# Patient Record
Sex: Female | Born: 1965 | Race: White | Hispanic: No | Marital: Married | Smoking: Never smoker
Health system: Southern US, Community
[De-identification: ages and names within clinical notes are randomized; demographics above are authoritative.]

## PROBLEM LIST (undated history)

## (undated) DIAGNOSIS — G43909 Migraine, unspecified, not intractable, without status migrainosus: Secondary | ICD-10-CM

## (undated) DIAGNOSIS — M1990 Unspecified osteoarthritis, unspecified site: Secondary | ICD-10-CM

## (undated) DIAGNOSIS — K649 Unspecified hemorrhoids: Secondary | ICD-10-CM

## (undated) DIAGNOSIS — E039 Hypothyroidism, unspecified: Secondary | ICD-10-CM

## (undated) DIAGNOSIS — C801 Malignant (primary) neoplasm, unspecified: Secondary | ICD-10-CM

## (undated) DIAGNOSIS — K219 Gastro-esophageal reflux disease without esophagitis: Secondary | ICD-10-CM

## (undated) DIAGNOSIS — E079 Disorder of thyroid, unspecified: Secondary | ICD-10-CM

## (undated) HISTORY — PX: TONSILLECTOMY: SUR1361

---

## 2009-12-15 HISTORY — PX: BLADDER SUSPENSION: SHX72

## 2014-11-30 ENCOUNTER — Encounter: Payer: Self-pay | Admitting: *Deleted

## 2014-11-30 ENCOUNTER — Emergency Department (INDEPENDENT_AMBULATORY_CARE_PROVIDER_SITE_OTHER)
Admission: EM | Admit: 2014-11-30 | Discharge: 2014-11-30 | Disposition: A | Discharge status: Home / Self Care | Payer: BC Managed Care – PPO | Source: Home / Self Care | Attending: Emergency Medicine | Admitting: Emergency Medicine

## 2014-11-30 DIAGNOSIS — H65192 Other acute nonsuppurative otitis media, left ear: Secondary | ICD-10-CM

## 2014-11-30 HISTORY — DX: Disorder of thyroid, unspecified: E07.9

## 2014-11-30 HISTORY — DX: Migraine, unspecified, not intractable, without status migrainosus: G43.909

## 2014-11-30 MED ORDER — CEFDINIR 300 MG PO CAPS: 300.0000 mg | ORAL_CAPSULE | Freq: Two times a day (BID) | ORAL | Status: DC

## 2014-11-30 NOTE — ED Notes (Addendum)
Pt c/o LT ear pain x 2 days. Denies fever.

## 2014-11-30 NOTE — ED Provider Notes (Signed)
CSN: [id number]     Arrival date & time 11/30/14  [id number] History   First MD Initiated Contact with Patient 11/30/14 [phone]     Chief Complaint  Patient presents with  . Otalgia   (Consider location/radiation/quality/duration/timing/severity/associated sxs/prior Treatment) Patient is a 48 y.o. female presenting with ear pain. The history is provided by the patient. No language interpreter was used.  Otalgia Location:  Left Behind ear:  Redness Quality:  Aching Severity:  Moderate Onset quality:  Gradual Duration:  4 days Timing:  Constant Progression:  Worsening Chronicity:  New Relieved by:  Nothing Worsened by:  Nothing tried Ineffective treatments:  None tried Associated symptoms: congestion and cough     Past Medical History  Diagnosis Date  . Thyroid disease   . Migraine    Past Surgical History  Procedure Laterality Date  . Tonsillectomy     Family History  Problem Relation Age of Onset  . Hypertension Mother   . Hypertension Father   . Diabetes Father    History  Substance Use Topics  . Smoking status: Never Smoker   . Smokeless tobacco: Not on file  . Alcohol Use: No   OB History    No data available     Review of Systems  HENT: Positive for congestion and ear pain.   Respiratory: Positive for cough.   All other systems reviewed and are negative.   Allergies  Review of patient's allergies indicates no known allergies.  Home Medications   Prior to Admission medications   Medication Sig Start Date End Date Taking? Authorizing Provider  esomeprazole (NEXIUM) 40 MG capsule Take 40 mg by mouth daily at 12 noon.   Yes Historical Provider, MD  levothyroxine (SYNTHROID, LEVOTHROID) 125 MCG tablet Take 125 mcg by mouth daily before breakfast.   Yes Historical Provider, MD  Multiple Vitamin (MULTI-VITAMINS PO) Take by mouth.   Yes Historical Provider, MD  SUMAtriptan (IMITREX) 25 MG tablet Take 25 mg by mouth every 2 (two) hours as needed for migraine or  headache. May repeat in 2 hours if headache persists or recurs.   Yes Historical Provider, MD  topiramate (TOPAMAX) 100 MG tablet Take 100 mg by mouth 2 (two) times daily.   Yes Historical Provider, MD  cefdinir (OMNICEF) 300 MG capsule Take 1 capsule (300 mg total) by mouth 2 (two) times daily. 11/30/14   Fransico Meadow, PA-C   BP 114/62 mmHg  Pulse 63  Temp(Src) 97.9 F (36.6 C) (Oral)  Resp 16  Ht 5\' 6"  (1.676 m)  Wt 203 lb (92.08 kg)  BMI 32.78 kg/m2  SpO2 99% Physical Exam  Constitutional: She is oriented to person, place, and time. She appears well-developed and well-nourished.  HENT:  Head: Normocephalic and atraumatic.  Left tm erythematous  Eyes: EOM are normal.  Neck: Normal range of motion.  Cardiovascular: Normal rate.   Pulmonary/Chest: Effort normal and breath sounds normal.  Abdominal: Soft. She exhibits no distension.  Musculoskeletal: Normal range of motion.  Neurological: She is alert and oriented to person, place, and time.  Psychiatric: She has a normal mood and affect.  Nursing note and vitals reviewed.   ED Course  Procedures (including critical care time) Labs Review Labs Reviewed - No data to display  Imaging Review No results found.   MDM   1. Acute nonsuppurative otitis media of left ear    cefdiner Referral for primary care AVS  Fransico Meadow, PA-C 11/30/14 0093

## 2014-11-30 NOTE — Discharge Instructions (Signed)

## 2016-02-11 ENCOUNTER — Encounter: Payer: Self-pay | Admitting: *Deleted

## 2016-02-11 ENCOUNTER — Emergency Department (INDEPENDENT_AMBULATORY_CARE_PROVIDER_SITE_OTHER)
Admission: EM | Admit: 2016-02-11 | Discharge: 2016-02-11 | Disposition: A | Discharge status: Home / Self Care | Payer: BLUE CROSS/BLUE SHIELD | Source: Home / Self Care | Attending: Family Medicine | Admitting: Family Medicine

## 2016-02-11 DIAGNOSIS — J069 Acute upper respiratory infection, unspecified: Secondary | ICD-10-CM | POA: Diagnosis not present

## 2016-02-11 DIAGNOSIS — B9789 Other viral agents as the cause of diseases classified elsewhere: Principal | ICD-10-CM

## 2016-02-11 NOTE — ED Notes (Signed)
Pt c/o productive cough, sneezing, body aches, temp 100.1, and bilateral ear ache x 4 days.

## 2016-02-11 NOTE — ED Provider Notes (Signed)
CSN: [id number]     Arrival date & time 02/11/16  [id number] History   First MD Initiated Contact with Patient 02/11/16 1022     Chief Complaint  Patient presents with  . Cough  . Otalgia      HPI Comments: Four days ago patient developed flu-like illness including myalgias, headache, fever/chills, fatigue, and cough.  Also has mild nasal congestion and sore throat.  Cough is non-productive and somewhat worse at night.  No pleuritic pain or shortness of breath. Her symptoms are generally improving except that her ears feel clogged.  She would like to ensure that she does not have an ear infection.    The history is provided by the patient.    Past Medical History  Diagnosis Date  . Thyroid disease   . Migraine    Past Surgical History  Procedure Laterality Date  . Tonsillectomy     Family History  Problem Relation Age of Onset  . Hypertension Mother   . Hypertension Father   . Diabetes Father    Social History  Substance Use Topics  . Smoking status: Never Smoker   . Smokeless tobacco: None  . Alcohol Use: No   OB History    No data available     Review of Systems + sore throat + hoarse + sneezing + cough No pleuritic pain No wheezing + nasal congestion + post-nasal drainage No sinus pain/pressure No itchy/red eyes ? earache No hemoptysis No SOB + fever, + chills No nausea No vomiting No abdominal pain No diarrhea No urinary symptoms No skin rash + fatigue + myalgias + headache Used OTC meds without relief d Allergies  Review of patient's allergies indicates no known allergies.  Home Medications   Prior to Admission medications   Medication Sig Start Date End Date Taking? Authorizing Provider  omeprazole (PRILOSEC) 40 MG capsule Take 40 mg by mouth daily.   Yes Historical Provider, MD  levothyroxine (SYNTHROID, LEVOTHROID) 125 MCG tablet Take 125 mcg by mouth daily before breakfast.    Historical Provider, MD  Multiple Vitamin (MULTI-VITAMINS PO) Take  by mouth.    Historical Provider, MD  SUMAtriptan (IMITREX) 25 MG tablet Take 25 mg by mouth every 2 (two) hours as needed for migraine or headache. May repeat in 2 hours if headache persists or recurs.    Historical Provider, MD  topiramate (TOPAMAX) 100 MG tablet Take 100 mg by mouth 2 (two) times daily.    Historical Provider, MD   Meds Ordered and Administered this Visit  Medications - No data to display  BP 123/79 mmHg  Pulse 77  Temp(Src) 98 F (36.7 C) (Oral)  Resp 16  Ht 5' 5.5" (1.664 m)  Wt 201 lb (91.173 kg)  BMI 32.93 kg/m2  SpO2 100% No data found.   Physical Exam Nursing notes and Vital Signs reviewed. Appearance:  Patient appears stated age, and in no acute distress Eyes:  Pupils are equal, round, and reactive to light and accomodation.  Extraocular movement is intact.  Conjunctivae are not inflamed  Ears:  Canals normal.  Tympanic membranes normal.  Nose:  Mildly congested turbinates.  No sinus tenderness.   Pharynx:  Normal Neck:  Supple.  Tender enlarged posterior nodes are palpated bilaterally  Lungs:  Clear to auscultation.  Breath sounds are equal.  Moving air well. Heart:  Regular rate and rhythm without murmurs, rubs, or gallops.  Abdomen:  Nontender without masses or hepatosplenomegaly.  Bowel sounds are present.  No CVA  or flank tenderness.  Extremities:  No edema.  Skin:  No rash present.   ED Course  Procedures  None     Labs Reviewed -   Tympanogram:  Normal both ears    MDM   1. Viral URI with cough    There is no evidence of bacterial infection today.  Treat symptomatically for now  Take plain guaifenesin (1200mg  extended release tabs such as Mucinex) twice daily, with plenty of water, for cough and congestion.  Get adequate rest.   May use Afrin nasal spray (or generic oxymetazoline) twice daily for about 5 days and then discontinue.  Also recommend using saline nasal spray several times daily and saline nasal irrigation (AYR is a common  brand).  May add Flonase nasal spray each morning after using Afrin nasal spray and saline nasal irrigation. Try warm salt water gargles for sore throat.  Stop all antihistamines for now, and other non-prescription cough/cold preparations.   Follow-up with family doctor if not improving about one week     Kandra Nicolas, MD 02/11/16 1121

## 2016-02-11 NOTE — Discharge Instructions (Signed)
Take plain guaifenesin (1200mg  extended release tabs such as Mucinex) twice daily, with plenty of water, for cough and congestion.  Get adequate rest.   May use Afrin nasal spray (or generic oxymetazoline) twice daily for about 5 days and then discontinue.  Also recommend using saline nasal spray several times daily and saline nasal irrigation (AYR is a common brand).  May add Flonase nasal spray each morning after using Afrin nasal spray and saline nasal irrigation. Try warm salt water gargles for sore throat.  Stop all antihistamines for now, and other non-prescription cough/cold preparations.   Follow-up with family doctor if not improving about one week

## 2016-03-19 ENCOUNTER — Other Ambulatory Visit: Payer: Self-pay | Admitting: Family Medicine

## 2016-03-19 DIAGNOSIS — G43909 Migraine, unspecified, not intractable, without status migrainosus: Secondary | ICD-10-CM | POA: Diagnosis not present

## 2016-03-19 DIAGNOSIS — M542 Cervicalgia: Secondary | ICD-10-CM | POA: Diagnosis not present

## 2016-03-19 DIAGNOSIS — Z8249 Family history of ischemic heart disease and other diseases of the circulatory system: Secondary | ICD-10-CM | POA: Diagnosis not present

## 2016-03-31 ENCOUNTER — Ambulatory Visit
Admission: RE | Admit: 2016-03-31 | Discharge: 2016-03-31 | Disposition: A | Discharge status: Home / Self Care | Payer: BLUE CROSS/BLUE SHIELD | Source: Ambulatory Visit | Attending: Family Medicine | Admitting: Family Medicine

## 2016-03-31 DIAGNOSIS — G43909 Migraine, unspecified, not intractable, without status migrainosus: Secondary | ICD-10-CM

## 2016-03-31 DIAGNOSIS — Z8249 Family history of ischemic heart disease and other diseases of the circulatory system: Secondary | ICD-10-CM

## 2016-03-31 DIAGNOSIS — R51 Headache: Secondary | ICD-10-CM | POA: Diagnosis not present

## 2016-04-04 ENCOUNTER — Ambulatory Visit (INDEPENDENT_AMBULATORY_CARE_PROVIDER_SITE_OTHER): Payer: BLUE CROSS/BLUE SHIELD | Admitting: Neurology

## 2016-04-04 ENCOUNTER — Encounter: Payer: Self-pay | Admitting: Neurology

## 2016-04-04 VITALS — BP 131/80 | HR 72 | Ht 65.5 in | Wt 206.4 lb

## 2016-04-04 DIAGNOSIS — I10 Essential (primary) hypertension: Secondary | ICD-10-CM | POA: Diagnosis not present

## 2016-04-04 DIAGNOSIS — R51 Headache: Secondary | ICD-10-CM | POA: Diagnosis not present

## 2016-04-04 DIAGNOSIS — H538 Other visual disturbances: Secondary | ICD-10-CM | POA: Diagnosis not present

## 2016-04-04 DIAGNOSIS — H93A2 Pulsatile tinnitus, left ear: Secondary | ICD-10-CM

## 2016-04-04 DIAGNOSIS — R519 Headache, unspecified: Secondary | ICD-10-CM

## 2016-04-04 DIAGNOSIS — E038 Other specified hypothyroidism: Secondary | ICD-10-CM

## 2016-04-04 DIAGNOSIS — G43709 Chronic migraine without aura, not intractable, without status migrainosus: Secondary | ICD-10-CM

## 2016-04-04 DIAGNOSIS — M542 Cervicalgia: Secondary | ICD-10-CM | POA: Diagnosis not present

## 2016-04-04 MED ORDER — NORTRIPTYLINE HCL 25 MG PO CAPS: 25.0000 mg | ORAL_CAPSULE | Freq: Every day | ORAL | Status: DC

## 2016-04-04 MED ORDER — SUMATRIPTAN SUCCINATE 3 MG/0.5ML ~~LOC~~ SOAJ: 1.0000 | SUBCUTANEOUS | Status: DC

## 2016-04-04 NOTE — Progress Notes (Signed)
Faxed enrollment form to [facility]: [employer] For zembrace/sumatriptan tablet. Fax: [phone]. Received confirmation.

## 2016-04-04 NOTE — Progress Notes (Signed)
[id number]    Provider:  Dr Jaynee Eagles Referring Provider: Donald Prose, MD Primary Care Physician:  Lynne Logan, MD  CC:  migraines  HPI:  Sarah Hayes is a 50 y.o. female here as a referral from Dr. Nancy Fetter for migraines. PMHx HTN, hypothyroidism, migraine. She has been on Topamax for 9 years. She has been on 100mg . In the past this has been good. Migraines started worsening in the last few months, since January worsening with increased frequencya nd severity, can be severe. She has been under stress. She has shoulder pain and is also going to an orthopaedist. The pain travels from the shoulder up tot he head. That has been exacerbating her migraines. Starts on the right, behind the eyes, spreads to the whole head, pounding, throbbing, light sensitivity, phonophobia. Needs to go to bed in a dark room. She has tried imitrex, lately not working. She has had a headache since Saturday. Las Wed and Thursday she had severe headache. Imitrex is working less. Sometimes the migraine can be instantaneous. She has had injections before and they worked great but insurance didn't pay. She has 20 headaches or more a month, She is getting headaches in the middle of the night and in the morning. She has at least 10 being migrainous per month for the last 3 months. She hydrates well. She is not sleeping well recently. She goes to be at 11am, she gets up at [facility] and she is not getting more than a fw hours of sleep a night. She has some right-sided vision changes. She has a FHx of aneurysms. She has ringing in the ears in a pulsing quality of the left ear. No aura. No history of heart disease or symptoms. No other focal neurologic complaints or symptoms/signs. No FHx of migraines. Can have vomiting with headaches making oral acute management difficult also the onset to max severity is very fast making oral medications less effective.   Meds tried: Topamax, Imitrex PO and nasal, other medications and triptans  (will request records), OTC meds such as nsaids and tylenol.   Reviewed notes, labs and imaging from outside physicians, which showed:  Sister with a wide-necked left supraclinoid ICA aneurysm.   CT of the head 03/31/2016(personally reviewed and agree with findings below): No evidence for acute infarction, hemorrhage, mass lesion, hydrocephalus, or extra-axial fluid. No atrophy or white matter disease. Intact calvarium. No acute sinus or mastoid disease.  Labs 12/25/2015: CBC with diff and CMp unremarkable (creatinine 0.81), tsh 3.09, ldl 101,   IMPRESSION: Negative exam. If further investigation desired, with regard to the family history of aneurysm, MRA or CTA of the intracranial circulation recommended for further evaluation.  Review of Systems: Patient complains of symptoms per HPI as well as the following symptoms:eye pain, headache, numbness, restless legs, right eye pain. Pertinent negatives per HPI. All others negative.   Social History   Social History  . Marital Status: Married    Spouse Name: Sarah Hayes  . Number of Children: 3  . Years of Education: 12   Occupational History  . [employer]    Social History Main Topics  . Smoking status: Never Smoker   . Smokeless tobacco: Not on file  . Alcohol Use: No  . Drug Use: No  . Sexual Activity: Not on file   Other Topics Concern  . Not on file   Social History Narrative   Lives with family   Caffeine use: none    Family History  Problem Relation  Age of Onset  . Hypertension Mother   . Hypertension Father   . Diabetes Father   . Migraines Neg Hx     Past Medical History  Diagnosis Date  . Thyroid disease   . Migraine     Past Surgical History  Procedure Laterality Date  . Tonsillectomy      Current Outpatient Prescriptions  Medication Sig Dispense Refill  . levothyroxine (SYNTHROID, LEVOTHROID) 125 MCG tablet Take 125 mcg by mouth daily before breakfast.    . Multiple Vitamin (MULTI-VITAMINS PO)  Take by mouth.    Marland Kitchen omeprazole (PRILOSEC) 40 MG capsule Take 40 mg by mouth daily.    Marland Kitchen topiramate (TOPAMAX) 100 MG tablet Take 100 mg by mouth 2 (two) times daily.    . nortriptyline (PAMELOR) 25 MG capsule Take 1 capsule (25 mg total) by mouth at bedtime. 30 capsule 12  . SUMAtriptan Succinate (ZEMBRACE SYMTOUCH) 3 MG/0.5ML SOAJ Inject 1 Dose into the skin every 1 hour x 4 doses. 10 pen 12   No current facility-administered medications for this visit.    Allergies as of 04/04/2016  . (No Known Allergies)    Vitals: BP 131/80 mmHg  Pulse 72  Ht 5' 5.5" (1.664 m)  Wt 206 lb 6.4 oz (93.622 kg)  BMI 33.81 kg/m2 Last Weight:  Wt Readings from Last 1 Encounters:  04/04/16 206 lb 6.4 oz (93.622 kg)   Last Height:   Ht Readings from Last 1 Encounters:  04/04/16 5' 5.5" (1.664 m)    Physical exam: Exam: Gen: NAD, conversant, well nourised, obese, well groomed                     CV: RRR, no MRG. No Carotid Bruits. No peripheral edema, warm, nontender Eyes: Conjunctivae clear without exudates or hemorrhage  Neuro: Detailed Neurologic Exam  Speech:    Speech is normal; fluent and spontaneous with normal comprehension.  Cognition:    The patient is oriented to person, place, and time;     recent and remote memory intact;     language fluent;     normal attention, concentration,     fund of knowledge Cranial Nerves:    The pupils are equal, round, and reactive to light. The fundi are normal and spontaneous venous pulsations are present. Visual fields are full to finger confrontation. Extraocular movements are intact. Trigeminal sensation is intact and the muscles of mastication are normal. The face is symmetric. The palate elevates in the midline. Hearing intact. Voice is normal. Shoulder shrug is normal. The tongue has normal motion without fasciculations.   Coordination:    Normal finger to nose and heel to shin. Normal rapid alternating movements.   Gait:    Heel-toe and  tandem gait are normal.   Motor Observation:    No asymmetry, no atrophy, and no involuntary movements noted. Tone:    Normal muscle tone.    Posture:    Posture is normal. normal erect    Strength:    Strength is V/V in the upper and lower limbs.      Sensation: intact to LT     Reflex Exam:  DTR's:    Deep tendon reflexes in the upper and lower extremities are normal bilaterally.   Toes:    The toes are downgoing bilaterally.   Clonus:    Clonus is absent.      Assessment/Plan:  50 year with worsening migraines, chronic, no aura, not intractable. Exacerbated by not  sleeping well, recent neck and shoulde rpain (seeing orthopaedics soon).    As far as your medications are concerned, I would like to suggest: Continue Topamax, however increases above 200mg /day often cause side effects will try Nortriptyline 25mg  at night. Zembrace at onset. Can repeat every hour up to 4x a day. Nortriptyline at night,can help with migraines as well as insomnia and neck pain.  Discussed side effects including teratogenicity, do not Pregnant on this medication and use birth control. Serious side effects can include hypotension, hypertension, syncope, ventricular arrhythmias, QT prolongation and other cardiac side effects, stroke and seizures, ataxia tardive dyskinesias, extrapyramidal symptoms, increased intraocular pressure, leukopenia, thrombocytopenia, hallucinations, suicidality and other serious side effects. Common reactions include drowsiness, dry mouth, dizziness, constipation, blurred vision, palpitations, tachycardia, impaired coordination, increased appetite, nausea vomiting, weakness, confusion, disorientation, restlessness, anxiety and other side effects.    As far as diagnostic testing: CTA of the head due to pulsatile left ear tinnitus and FHx of aneurysms, bmp today  To prevent or relieve headaches, try the following: Cool Compress. Lie down and place a cool compress on your head.    Avoid headache triggers. If certain foods or odors seem to have triggered your migraines in the past, avoid them. A headache diary might help you identify triggers.  Include physical activity in your daily routine. Try a daily walk or other moderate aerobic exercise.  Manage stress. Find healthy ways to cope with the stressors, such as delegating tasks on your to-do list.  Practice relaxation techniques. Try deep breathing, yoga, massage and visualization.  Eat regularly. Eating regularly scheduled meals and maintaining a healthy diet might help prevent headaches. Also, drink plenty of fluids.  Follow a regular sleep schedule. Sleep deprivation might contribute to headaches Consider biofeedback. With this mind-body technique, you learn to control certain bodily functions - such as muscle tension, heart rate and blood pressure - to prevent headaches or reduce headache pain.    Proceed to emergency room if you experience new or worsening symptoms or symptoms do not resolve, if you have new neurologic symptoms or if headache is severe, or for any concerning symptom.   Sarina Ill, MD  [facility] [address] [place], [place] [address]  Phone [phone] Fax [phone]

## 2016-04-04 NOTE — Patient Instructions (Addendum)
Remember to drink plenty of fluid, eat healthy meals and do not skip any meals. Try to eat protein with a every meal and eat a healthy snack such as fruit or nuts in between meals. Try to keep a regular sleep-wake schedule and try to exercise daily, particularly in the form of walking, 20-30 minutes a day, if you can.   As far as your medications are concerned, I would like to suggest: Nortriptyline 25mg  at night. Zembrace at onset. Can repeat every hour up to 4x a day.   As far as diagnostic testing: CTA of the head, lab   Our phone number is [phone]. We also have an after hours call service for urgent matters and there is a physician on-call for urgent questions. For any emergencies you know to call 911 or go to the nearest emergency room

## 2016-04-12 ENCOUNTER — Encounter: Payer: Self-pay | Admitting: Neurology

## 2016-04-12 DIAGNOSIS — I10 Essential (primary) hypertension: Secondary | ICD-10-CM | POA: Insufficient documentation

## 2016-04-12 DIAGNOSIS — G43709 Chronic migraine without aura, not intractable, without status migrainosus: Secondary | ICD-10-CM | POA: Insufficient documentation

## 2016-04-12 DIAGNOSIS — E039 Hypothyroidism, unspecified: Secondary | ICD-10-CM | POA: Insufficient documentation

## 2016-04-17 ENCOUNTER — Telehealth: Payer: Self-pay | Admitting: Neurology

## 2016-04-17 ENCOUNTER — Ambulatory Visit
Admission: RE | Admit: 2016-04-17 | Discharge: 2016-04-17 | Disposition: A | Discharge status: Home / Self Care | Payer: BLUE CROSS/BLUE SHIELD | Source: Ambulatory Visit | Attending: Neurology | Admitting: Neurology

## 2016-04-17 DIAGNOSIS — H93A2 Pulsatile tinnitus, left ear: Secondary | ICD-10-CM

## 2016-04-17 DIAGNOSIS — R519 Headache, unspecified: Secondary | ICD-10-CM

## 2016-04-17 DIAGNOSIS — H538 Other visual disturbances: Secondary | ICD-10-CM

## 2016-04-17 DIAGNOSIS — R51 Headache: Secondary | ICD-10-CM | POA: Diagnosis not present

## 2016-04-17 MED ORDER — IOPAMIDOL (ISOVUE-370) INJECTION 76%
100.0000 mL | Freq: Once | INTRAVENOUS | Status: AC | PRN
  Administered 2016-04-17: 100 mL via INTRAVENOUS

## 2016-04-17 NOTE — Telephone Encounter (Signed)
Called again. Left 3 messages today. Sarah Hayes, could you call patient first thing in the morning. 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 This can be causing some pressure behind her eyes. Can be causing headaches. We need to do a more invasive test called a cerebral angiogram to further examine. If she has any worsening vision changes or worsening headaches she needs to go to the emergency room. Carotid-cavernous sinus fistulae occur because of traumatic or spontaneous leaking of the internal carotid artery or its branches. This results in pooling of blood into cavernous sinuses(space behind the eyes).   I know it is hard to understand over the phone, I can give her a call later tomorrow morning when I am not with patients. She is also welcome to come in tomorrow at 12:30 and I can explain this to her and show her the images. I just want her to know so that to get the cerebral angiogram ordered. Please let me know, thanks

## 2016-04-17 NOTE — Telephone Encounter (Signed)
Called patient about imaging, left a message. Will try back later.  IMPRESSION: 1. Asymmetric early enhancement of the left cavernous sinus and slight dilation of the left superior ophthalmic vein consistent with a carotid cavernous fistula. Recommend catheter cerebral arteriogram for further evaluation. 2. No evidence for aneurysm.

## 2016-04-17 NOTE — Telephone Encounter (Signed)
[facility] [phone] called sts MRI is in EPIC and ready to view. He said Dr Cecilie Lowers recommended a catheter and then cerebral arteriogram for further eval.

## 2016-04-18 ENCOUNTER — Other Ambulatory Visit: Payer: Self-pay | Admitting: Neurology

## 2016-04-18 ENCOUNTER — Ambulatory Visit (INDEPENDENT_AMBULATORY_CARE_PROVIDER_SITE_OTHER): Payer: BLUE CROSS/BLUE SHIELD | Admitting: Neurology

## 2016-04-18 ENCOUNTER — Encounter: Payer: Self-pay | Admitting: Neurology

## 2016-04-18 VITALS — BP 125/81 | HR 76 | Ht 65.5 in | Wt 206.4 lb

## 2016-04-18 DIAGNOSIS — R930 Abnormal findings on diagnostic imaging of skull and head, not elsewhere classified: Secondary | ICD-10-CM

## 2016-04-18 DIAGNOSIS — I671 Cerebral aneurysm, nonruptured: Secondary | ICD-10-CM

## 2016-04-18 DIAGNOSIS — I770 Arteriovenous fistula, acquired: Secondary | ICD-10-CM

## 2016-04-18 NOTE — Telephone Encounter (Signed)
Called [facility] about scheduling cerebral angiogram. Anderson Malta who schedules for cerebral angiogram was in consultation with Dr Estanislado Pandy at that time. She asked me to call back in 30 min to schedule. Advised to call [phone].

## 2016-04-18 NOTE — Telephone Encounter (Signed)
LVM for Anderson Malta in scheduling cerebral angiogram for pt. Gave [facility] phone number if she has further questions. Advised office now closed, we will reopen Monday at 8am.

## 2016-04-18 NOTE — Telephone Encounter (Signed)
Patient was seen in the office today and imaging discussed thanks

## 2016-04-18 NOTE — Telephone Encounter (Signed)
Patient returned Dr. Cathren Laine call. Please call [phone].

## 2016-04-18 NOTE — Telephone Encounter (Signed)
Called pt back. Relayed Dr Jaynee Eagles message below RE results. Pt verbalized understanding. Dr Jaynee Eagles also spoke to pt while on phone.

## 2016-04-19 DIAGNOSIS — I671 Cerebral aneurysm, nonruptured: Secondary | ICD-10-CM | POA: Insufficient documentation

## 2016-04-19 NOTE — Progress Notes (Signed)
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 She is here with husband. Reviewed imaging with patient and husband and showed them the area that is suspicious on CTA of the head and explained the images. Discussed the following: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 This can be causing some pressure behind her eyes. Can be causing headaches. We need to do a more invasive test called a cerebral angiogram to further examine. If she has any worsening vision changes or worsening headaches she needs to go to the emergency room ASAP. Carotid-cavernous sinus fistulae occur because of traumatic or spontaneous leaking of the internal carotid artery or its branches. This results in pooling of blood into cavernous sinuses(space behind the eyes). This can cause permanent vision loss, headaches, eye pain, redness of the eye, proptosis of the eye, abnormal eye movements. Patient is largely asymptomatic at this time except for headaches. Have referred for cerebral angiogram. If symptoms worsen patient is to go to the emergency room as soon as possible or call 911.  IMPRESSION: 1. Asymmetric early enhancement of the left cavernous sinus and slight dilation of the left superior ophthalmic vein consistent with a carotid cavernous fistula. Recommend catheter cerebral arteriogram for further evaluation. 2. No evidence for aneurysm.  HPI: Sarah Hayes is a 50 y.o. female here as a referral from Dr. Nancy Fetter for migraines. PMHx HTN, hypothyroidism, migraine. She has been on Topamax for 9 years. She has been on 100mg . In the  past this has been good. Migraines started worsening in the last few months, since January worsening with increased frequencya nd severity, can be severe. She has been under stress. She has shoulder pain and is also going to an orthopaedist. The pain travels from the shoulder up tot he head. That has been exacerbating her migraines. Starts on the right, behind the eyes, spreads to the whole head, pounding, throbbing, light sensitivity, phonophobia. Needs to go to bed in a dark room. She has tried imitrex, lately not working. She has had a headache since Saturday. Las Wed and Thursday she had severe headache. Imitrex is working less. Sometimes the migraine can be instantaneous. She has had injections before and they worked great but insurance didn't pay. She has 20 headaches or more a month, She is getting headaches in the middle of the night and in the morning. She has at least 10 being migrainous per month for the last 3 months. She hydrates well. She is not sleeping well recently. She goes to be at 11am, she gets up at [facility] and she is not getting more than a fw hours of sleep a night. She has some right-sided vision changes. She has a FHx of aneurysms. She has ringing in the ears in a pulsing quality of the left ear. No aura. No history of heart disease or symptoms. No other focal neurologic complaints or symptoms/signs. No FHx of migraines. Can have vomiting with headaches making oral acute management difficult also the onset to max severity is very fast making oral medications less effective.   Meds tried: Topamax, Imitrex PO and nasal, other medications and triptans (will request records), OTC meds  such as nsaids and tylenol.   Reviewed notes, labs and imaging from outside physicians, which showed:  Sister with a wide-necked left supraclinoid ICA aneurysm.   CT of the head 03/31/2016(personally reviewed and agree with findings below): No evidence for acute infarction, hemorrhage, mass lesion,  hydrocephalus, or extra-axial fluid. No atrophy or white matter disease. Intact calvarium. No acute sinus or mastoid disease.  Labs 12/25/2015: CBC with diff and CMp unremarkable (creatinine 0.81), tsh 3.09, ldl 101,   IMPRESSION: Negative exam. If further investigation desired, with regard to the family history of aneurysm, MRA or CTA of the intracranial circulation recommended for further evaluation.  Review of Systems: Patient complains of symptoms per HPI as well as the following symptoms:eye pain, headache, numbness, restless legs, right eye pain. Pertinent negatives per HPI. All others negative.   Social History   Social History  . Marital Status: Married    Spouse Name: [name]  . Number of Children: 3  . Years of Education: 12   Occupational History  . [employer]    Social History Main Topics  . Smoking status: Never Smoker   . Smokeless tobacco: Not on file  . Alcohol Use: No  . Drug Use: No  . Sexual Activity: Not on file   Other Topics Concern  . Not on file   Social History Narrative   Lives with family   Caffeine use: none    Family History  Problem Relation Age of Onset  . Hypertension Mother   . Hypertension Father   . Diabetes Father   . Migraines Neg Hx     Past Medical History  Diagnosis Date  . Thyroid disease   . Migraine     Past Surgical History  Procedure Laterality Date  . Tonsillectomy      Current Outpatient Prescriptions  Medication Sig Dispense Refill  . levothyroxine (SYNTHROID, LEVOTHROID) 125 MCG tablet Take 125 mcg by mouth daily before breakfast.    . methocarbamol (ROBAXIN) 500 MG tablet Take 500 mg by mouth every 8 (eight) hours.  1  . Multiple Vitamin (MULTI-VITAMINS PO) Take by mouth.    . nortriptyline (PAMELOR) 25 MG capsule Take 1 capsule (25 mg total) by mouth at bedtime. 30 capsule 12  . omeprazole (PRILOSEC) 40 MG capsule Take 40 mg by mouth daily.    . SUMAtriptan Succinate (ZEMBRACE SYMTOUCH) 3 MG/0.5ML  SOAJ Inject 1 Dose into the skin every 1 hour x 4 doses. 10 pen 12  . topiramate (TOPAMAX) 100 MG tablet Take 100 mg by mouth 2 (two) times daily.     No current facility-administered medications for this visit.    Allergies as of 04/18/2016  . (No Known Allergies)    Vitals: BP 125/81 mmHg  Pulse 76  Ht 5' 5.5" (1.664 m)  Wt 206 lb 6.4 oz (93.622 kg)  BMI 33.81 kg/m2 Last Weight:  Wt Readings from Last 1 Encounters:  04/18/16 206 lb 6.4 oz (93.622 kg)   Last Height:   Ht Readings from Last 1 Encounters:  04/18/16 5' 5.5" (1.664 m)    Neuro: Detailed Neurologic Exam  Speech:  Speech is normal; fluent and spontaneous with normal comprehension.  Cognition:  The patient is oriented to person, place, and time;   recent and remote memory intact;   language fluent;   normal attention, concentration,   fund of knowledge Cranial Nerves:  The pupils are equal, round, and reactive to light. The fundi are normal and spontaneous venous pulsations  are present. Visual fields are full to finger confrontation. Extraocular movements are intact. Trigeminal sensation is intact and the muscles of mastication are normal. The face is symmetric. The palate elevates in the midline. Hearing intact. Voice is normal. Shoulder shrug is normal. The tongue has normal motion without fasciculations.   Coordination:  Normal finger to nose and heel to shin. Normal rapid alternating movements.   Gait:  Heel-toe and tandem gait are normal.   Motor Observation:  No asymmetry, no atrophy, and no involuntary movements noted. Tone:  Normal muscle tone.   Posture:  Posture is normal. normal erect   Strength:  Strength is V/V in the upper and lower limbs.    Sensation: intact to LT   Reflex Exam:  DTR's:  Deep tendon reflexes in the upper and lower extremities are normal bilaterally.  Toes:  The toes are downgoing bilaterally.  Clonus:   Clonus is absent.     Assessment/Plan: 50 year with worsening migraines, chronic, no aura, not intractable. Exacerbated by not sleeping well, recent neck and shoulde rpain (seeing orthopaedics soon).   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 Adeline discussion with patient and her husband and we reviewed images together.his can cause permanent vision loss, headaches, eye pain, redness of the eye, proptosis of the eye, abnormal eye movements. Patient is largely asymptomatic at this time except for headaches. Have referred for cerebral angiogram. If symptoms worsen patient is to go to the emergency room as soon as possible or call 911. I referred her for cerebral angiogram. Watch blood pressure, no strenuous exercises.  As far as your medications are concerned, I would like to suggest: Continue Topamax, however increases above 200mg /day often cause side effects will try Nortriptyline 25mg  at night. Zembrace at onset. Can repeat every hour up to 4x a day. Nortriptyline at night,can help with migraines as well as insomnia and neck pain. Discussed side effects including teratogenicity, do not Pregnant on this medication and use birth control. Serious side effects can include hypotension, hypertension, syncope, ventricular arrhythmias, QT prolongation and other cardiac side effects, stroke and seizures, ataxia tardive dyskinesias, extrapyramidal symptoms, increased intraocular pressure, leukopenia, thrombocytopenia, hallucinations, suicidality and other serious side effects. Common reactions include drowsiness, dry mouth, dizziness, constipation, blurred vision, palpitations, tachycardia, impaired coordination, increased appetite, nausea vomiting, weakness, confusion, disorientation, restlessness, anxiety and other side effects.    As far as diagnostic testing: CTA of the head due to pulsatile left ear tinnitus and FHx of aneurysms, bmp today  To prevent or  relieve headaches, try the following:  Cool Compress. Lie down and place a cool compress on your head.   Avoid headache triggers. If certain foods or odors seem to have triggered your migraines in the past, avoid them. A headache diary might help you identify triggers.   Include physical activity in your daily routine. Try a daily walk or other moderate aerobic exercise.   Manage stress. Find healthy ways to cope with the stressors, such as delegating tasks on your to-do list.   Practice relaxation techniques. Try deep breathing, yoga, massage and visualization.   Eat regularly. Eating regularly scheduled meals and maintaining a healthy diet might help prevent headaches. Also, drink plenty of fluids.   Follow a regular sleep schedule. Sleep deprivation might contribute to headaches  Consider biofeedback. With this mind-body technique, you learn to control certain bodily functions - such as muscle tension, heart rate and blood pressure - to prevent headaches or  reduce headache pain.   Proceed to emergency room if you experience new or worsening symptoms or symptoms do not resolve, if you have new neurologic symptoms or if headache is severe, or for any concerning symptom.   Sarina Ill, MD  [facility] [address] [place], [place] [address]  Phone [phone] Fax [phone]  A total of 30 minutes was spent face-to-face with this patient. Over half this time was spent on counseling patient on the carotid sinus cyst diagnosis and different diagnostic and therapeutic options available.

## 2016-04-21 ENCOUNTER — Encounter: Payer: Self-pay | Admitting: Neurology

## 2016-04-22 NOTE — Telephone Encounter (Signed)
Thank you!  Please let patient know

## 2016-04-22 NOTE — Telephone Encounter (Signed)
Caryl Pina called from Dr Estanislado Pandy office [phone] or [phone] said she saw the message and see the order. Dr Estanislado Pandy has look at it and she will try to get it scheduled today.

## 2016-04-22 NOTE — Telephone Encounter (Signed)
Dr Ahern- FYI 

## 2016-04-23 ENCOUNTER — Other Ambulatory Visit (HOSPITAL_COMMUNITY): Payer: Self-pay | Admitting: Interventional Radiology

## 2016-04-23 DIAGNOSIS — I671 Cerebral aneurysm, nonruptured: Secondary | ICD-10-CM

## 2016-04-23 NOTE — Telephone Encounter (Signed)
Called pt. Relayed that Dr Estanislado Pandy office (most likely [place]) can see order for cerebral angiogram and should be calling her to schedule. Gave pt number to office to call if she does not hear [phone] . Told her to call if she has further questions. She verbalized understanding.

## 2016-04-25 ENCOUNTER — Ambulatory Visit (HOSPITAL_COMMUNITY): Payer: BLUE CROSS/BLUE SHIELD

## 2016-04-28 ENCOUNTER — Other Ambulatory Visit: Payer: Self-pay | Admitting: General Surgery

## 2016-04-29 ENCOUNTER — Other Ambulatory Visit: Payer: Self-pay | Admitting: Radiology

## 2016-04-30 ENCOUNTER — Other Ambulatory Visit: Payer: Self-pay | Admitting: Neurology

## 2016-04-30 ENCOUNTER — Ambulatory Visit (HOSPITAL_COMMUNITY)
Admission: RE | Admit: 2016-04-30 | Discharge: 2016-04-30 | Disposition: A | Discharge status: Home / Self Care | Payer: BLUE CROSS/BLUE SHIELD | Source: Ambulatory Visit | Attending: Neurology | Admitting: Neurology

## 2016-04-30 ENCOUNTER — Encounter (HOSPITAL_COMMUNITY): Payer: Self-pay

## 2016-04-30 DIAGNOSIS — Z79899 Other long term (current) drug therapy: Secondary | ICD-10-CM | POA: Diagnosis not present

## 2016-04-30 DIAGNOSIS — I671 Cerebral aneurysm, nonruptured: Secondary | ICD-10-CM

## 2016-04-30 DIAGNOSIS — Z8489 Family history of other specified conditions: Secondary | ICD-10-CM | POA: Insufficient documentation

## 2016-04-30 DIAGNOSIS — H93A2 Pulsatile tinnitus, left ear: Secondary | ICD-10-CM | POA: Insufficient documentation

## 2016-04-30 DIAGNOSIS — Z8249 Family history of ischemic heart disease and other diseases of the circulatory system: Secondary | ICD-10-CM | POA: Insufficient documentation

## 2016-04-30 DIAGNOSIS — E079 Disorder of thyroid, unspecified: Secondary | ICD-10-CM | POA: Insufficient documentation

## 2016-04-30 DIAGNOSIS — R51 Headache: Secondary | ICD-10-CM | POA: Diagnosis not present

## 2016-04-30 DIAGNOSIS — G43909 Migraine, unspecified, not intractable, without status migrainosus: Secondary | ICD-10-CM | POA: Diagnosis not present

## 2016-04-30 DIAGNOSIS — Z833 Family history of diabetes mellitus: Secondary | ICD-10-CM | POA: Diagnosis not present

## 2016-04-30 LAB — CBC
HCT: 39.3 % (ref 36.0–46.0)
HEMOGLOBIN: 13.3 g/dL (ref 12.0–15.0)
MCH: 29.2 pg (ref 26.0–34.0)
MCHC: 33.8 g/dL (ref 30.0–36.0)
MCV: 86.4 fL (ref 78.0–100.0)
PLATELETS: 126 10*3/uL — AB (ref 150–400)
RBC: 4.55 MIL/uL (ref 3.87–5.11)
RDW: 13.3 % (ref 11.5–15.5)
WBC: 6.1 10*3/uL (ref 4.0–10.5)

## 2016-04-30 LAB — PREGNANCY, URINE: Preg Test, Ur: NEGATIVE

## 2016-04-30 LAB — BASIC METABOLIC PANEL
ANION GAP: 11 (ref 5–15)
BUN: 20 mg/dL (ref 6–20)
CALCIUM: 9 mg/dL (ref 8.9–10.3)
CO2: 22 mmol/L (ref 22–32)
Chloride: 110 mmol/L (ref 101–111)
Creatinine, Ser: 0.81 mg/dL (ref 0.44–1.00)
Glucose, Bld: 107 mg/dL — ABNORMAL HIGH (ref 65–99)
Potassium: 3.6 mmol/L (ref 3.5–5.1)
Sodium: 143 mmol/L (ref 135–145)

## 2016-04-30 LAB — PROTIME-INR
INR: 1.06 (ref 0.00–1.49)
PROTHROMBIN TIME: 14 s (ref 11.6–15.2)

## 2016-04-30 LAB — APTT: APTT: 20 s — AB (ref 24–37)

## 2016-04-30 MED ORDER — HEPARIN SOD (PORK) LOCK FLUSH 100 UNIT/ML IV SOLN
INTRAVENOUS | Status: AC | PRN
  Administered 2016-04-30: 500 [IU] via INTRAVENOUS

## 2016-04-30 MED ORDER — IOPAMIDOL (ISOVUE-300) INJECTION 61%
INTRAVENOUS | Status: AC
  Administered 2016-04-30: 25 mL
  Filled 2016-04-30: qty 100

## 2016-04-30 MED ORDER — LIDOCAINE HCL 1 % IJ SOLN
INTRAMUSCULAR | Status: AC
  Administered 2016-04-30: 10 mL
  Filled 2016-04-30: qty 20

## 2016-04-30 MED ORDER — FENTANYL CITRATE (PF) 100 MCG/2ML IJ SOLN
INTRAMUSCULAR | Status: AC
  Filled 2016-04-30: qty 2

## 2016-04-30 MED ORDER — MIDAZOLAM HCL 2 MG/2ML IJ SOLN
INTRAMUSCULAR | Status: AC | PRN
  Administered 2016-04-30: 1 mg via INTRAVENOUS

## 2016-04-30 MED ORDER — SODIUM CHLORIDE 0.9 % IV SOLN
INTRAVENOUS | Status: AC
  Administered 2016-04-30: 11:00:00 via INTRAVENOUS

## 2016-04-30 MED ORDER — SODIUM CHLORIDE 0.9 % IV SOLN: INTRAVENOUS | Status: DC

## 2016-04-30 MED ORDER — IOPAMIDOL (ISOVUE-300) INJECTION 61%
INTRAVENOUS | Status: AC
  Administered 2016-04-30: 75 mL
  Filled 2016-04-30: qty 150

## 2016-04-30 MED ORDER — FENTANYL CITRATE (PF) 100 MCG/2ML IJ SOLN
INTRAMUSCULAR | Status: AC | PRN
  Administered 2016-04-30: 25 ug via INTRAVENOUS

## 2016-04-30 MED ORDER — HEPARIN SODIUM (PORCINE) 1000 UNIT/ML IJ SOLN
INTRAMUSCULAR | Status: AC | PRN
  Administered 2016-04-30: 1000 [IU] via INTRAVENOUS

## 2016-04-30 MED ORDER — HEPARIN SODIUM (PORCINE) 1000 UNIT/ML IJ SOLN
INTRAMUSCULAR | Status: AC
  Filled 2016-04-30: qty 1

## 2016-04-30 MED ORDER — MIDAZOLAM HCL 2 MG/2ML IJ SOLN
INTRAMUSCULAR | Status: AC
  Filled 2016-04-30: qty 2

## 2016-04-30 NOTE — Sedation Documentation (Signed)
Patient is resting comfortably. 

## 2016-04-30 NOTE — Sedation Documentation (Signed)
Patient is resting comfortably. No complaints at this time, vitals stable. 

## 2016-04-30 NOTE — Sedation Documentation (Signed)
IR tech holding pressure at this time to right groin, site unremarkable.

## 2016-04-30 NOTE — Sedation Documentation (Signed)
Vital signs stable. Pt is resting. Responds to commands.

## 2016-04-30 NOTE — Sedation Documentation (Signed)
Patient is resting comfortably. No complaints at this time 

## 2016-04-30 NOTE — Sedation Documentation (Signed)
Pressure held by IR tech for 15 mins, vpad and tegaderm in place. Right groin site unremarkable.

## 2016-04-30 NOTE — Procedures (Signed)
S/P 4 vessel cerebral arteriogram,and Lt external carotid arteriogram. RT  CFA approac. Findings. 1.No abnormal AV communications,dissections or occlusions seen intracranially or extracranially. 2.Venous outflow WNLs.

## 2016-04-30 NOTE — Discharge Instructions (Signed)
Angiogram, Care After °Refer to this sheet in the next few weeks. These instructions provide you with information about caring for yourself after your procedure. Your health care provider may also give you more specific instructions. Your treatment has been planned according to current medical practices, but problems sometimes occur. Call your health care provider if you have any problems or questions after your procedure. °WHAT TO EXPECT AFTER THE PROCEDURE °After your procedure, it is typical to have the following: °· Bruising at the catheter insertion site that usually fades within 1-2 weeks. °· Blood collecting in the tissue (hematoma) that may be painful to the touch. It should usually decrease in size and tenderness within 1-2 weeks. °HOME CARE INSTRUCTIONS °· Take medicines only as directed by your health care provider. °· You may shower 24-48 hours after the procedure or as directed by your health care provider. Remove the bandage (dressing) and gently wash the site with plain soap and water. Pat the area dry with a clean towel. Do not rub the site, because this may cause bleeding. °· Do not take baths, swim, or use a hot tub until your health care provider approves. °· Check your insertion site every day for redness, swelling, or drainage. °· Do not apply powder or lotion to the site. °· Do not lift over 10 lb (4.5 kg) for 5 days after your procedure or as directed by your health care provider. °· Ask your health care provider when it is okay to: °¨ Return to work or school. °¨ Resume usual physical activities or sports. °¨ Resume sexual activity. °· Do not drive home if you are discharged the same day as the procedure. Have someone else drive you. °· You may drive 24 hours after the procedure unless otherwise instructed by your health care provider. °· Do not operate machinery or power tools for 24 hours after the procedure or as directed by your health care provider. °· If your procedure was done as an  outpatient procedure, which means that you went home the same day as your procedure, a responsible adult should be with you for the first 24 hours after you arrive home. °· Keep all follow-up visits as directed by your health care provider. This is important. °SEEK MEDICAL CARE IF: °· You have a fever. °· You have chills. °· You have increased bleeding from the catheter insertion site. Hold pressure on the site.  CALL 911 °SEEK IMMEDIATE MEDICAL CARE IF: °· You have unusual pain at the catheter insertion site. °· You have redness, warmth, or swelling at the catheter insertion site. °· You have drainage (other than a small amount of blood on the dressing) from the catheter insertion site. °· The catheter insertion site is bleeding, and the bleeding does not stop after 30 minutes of holding steady pressure on the site. °· The area near or just beyond the catheter insertion site becomes pale, cool, tingly, or numb. °  °This information is not intended to replace advice given to you by your health care provider. Make sure you discuss any questions you have with your health care provider. °  °Document Released: 06/19/2005 Document Revised: 12/22/2014 Document Reviewed: 05/04/2013 °Elsevier Interactive Patient Education ©2016 [employer] ° °

## 2016-04-30 NOTE — Sedation Documentation (Signed)
Vital signs stable. 

## 2016-04-30 NOTE — H&P (Signed)
Chief Complaint: Patient was seen in consultation today for cerebral arteriogram at the request of [place] B  Referring Physician(s): Melvenia Beam  Supervising Physician: Luanne Bras  Patient Status: Out-pt  History of Present Illness: Sarah Hayes is a 50 y.o. female   Hx migraine headaches Recent change in headaches causing some visual symptoms Left ear tinnitus Sister with recent cerebral aneurysm---in [place]- survived Work up with Dr Jaynee Eagles CTA Head 04/17/16: IMPRESSION: 1. Asymmetric early enhancement of the left cavernous sinus and slight dilation of the left superior ophthalmic vein consistent with a carotid cavernous fistula. Recommend catheter cerebral arteriogram for further evaluation. 2. No evidence for aneurysm.  Now scheduled for cerebral arteriogram for evaluation   Past Medical History  Diagnosis Date  . Thyroid disease   . Migraine     Past Surgical History  Procedure Laterality Date  . Tonsillectomy      Allergies: Review of patient's allergies indicates no known allergies.  Medications: Prior to Admission medications   Medication Sig Start Date End Date Taking? Authorizing Provider  levothyroxine (SYNTHROID, LEVOTHROID) 125 MCG tablet Take 125 mcg by mouth daily before breakfast.   Yes Historical Provider, MD  methocarbamol (ROBAXIN) 500 MG tablet Take 500 mg by mouth every 8 (eight) hours. 04/04/16  Yes Historical Provider, MD  Multiple Vitamin (MULTI-VITAMINS PO) Take 1 tablet by mouth daily.    Yes Historical Provider, MD  nortriptyline (PAMELOR) 25 MG capsule Take 1 capsule (25 mg total) by mouth at bedtime. 04/04/16  Yes Melvenia Beam, MD  omeprazole (PRILOSEC) 40 MG capsule Take 40 mg by mouth daily.   Yes Historical Provider, MD  SUMAtriptan (IMITREX) 100 MG tablet Take 100 mg by mouth every 2 (two) hours as needed for migraine.  04/10/16  Yes Historical Provider, MD  SUMAtriptan Succinate (ZEMBRACE SYMTOUCH) 3  MG/0.5ML SOAJ Inject 1 Dose into the skin every 1 hour x 4 doses. 04/04/16  Yes Melvenia Beam, MD  topiramate (TOPAMAX) 100 MG tablet Take 100 mg by mouth 2 (two) times daily.   Yes Historical Provider, MD     Family History  Problem Relation Age of Onset  . Hypertension Mother   . Hypertension Father   . Diabetes Father   . Migraines Neg Hx     Social History   Social History  . Marital Status: Married    Spouse Name: [name]  . Number of Children: 3  . Years of Education: 12   Occupational History  . [employer]    Social History Main Topics  . Smoking status: Never Smoker   . Smokeless tobacco: None  . Alcohol Use: No  . Drug Use: No  . Sexual Activity: Not Asked   Other Topics Concern  . None   Social History Narrative   Lives with family   Caffeine use: none    Review of Systems: A 12 point ROS discussed and pertinent positives are indicated in the HPI above.  All other systems are negative.  Review of Systems  Constitutional: Negative for fever, activity change, appetite change and fatigue.  HENT: Positive for tinnitus. Negative for hearing loss.   Eyes: Positive for visual disturbance.  Respiratory: Negative for shortness of breath.   Cardiovascular: Negative for chest pain.  Gastrointestinal: Negative for abdominal pain.  Musculoskeletal: Negative for back pain.  Neurological: Positive for headaches. Negative for dizziness, tremors, seizures, syncope, facial asymmetry, speech difficulty, weakness, light-headedness and numbness.  Psychiatric/Behavioral: Negative for confusion and decreased concentration.  Vital Signs: BP 130/88 mmHg  Temp(Src) 97.8 F (36.6 C) (Oral)  Resp 20  Ht 5\' 5"  (1.651 m)  Wt 206 lb (93.441 kg)  BMI 34.28 kg/m2  SpO2 99%  LMP 03/17/2016  Physical Exam  Constitutional: She is oriented to person, place, and time. She appears well-nourished.  HENT:  Head: Atraumatic.  Eyes: EOM are normal.  Neck: Neck supple.    Cardiovascular: Normal rate, regular rhythm and normal heart sounds.   No murmur heard. Pulmonary/Chest: Effort normal and breath sounds normal. She has no wheezes.  Abdominal: Soft. Bowel sounds are normal. There is no tenderness.  Musculoskeletal: Normal range of motion. She exhibits no edema or tenderness.  Neurological: She is alert and oriented to person, place, and time.  Skin: Skin is warm and dry.  Psychiatric: She has a normal mood and affect. Her behavior is normal. Judgment and thought content normal.  Nursing note and vitals reviewed.   Mallampati Score:  MD Evaluation Airway: WNL Heart: WNL Abdomen: WNL Chest/ Lungs: WNL ASA  Classification: 2 Mallampati/Airway Score: Two  Imaging: Ct Angio Head W/cm &/or Wo Cm  04/17/2016  CLINICAL DATA:  Chronic headaches with progression over the past several months. Blurred vision. Sister with cerebral aneurysm. Pulsatile tinnitus of the left the ear. EXAM: CT ANGIOGRAPHY HEAD TECHNIQUE: Multidetector CT imaging of the head was performed using the standard protocol during bolus administration of intravenous contrast. Multiplanar CT image reconstructions and MIPs were obtained to evaluate the vascular anatomy. CONTRAST:  100 mL Isovue 370 COMPARISON:  None. FINDINGS: CT HEAD Brain: No acute infarct, hemorrhage, or mass lesion is present. The basal ganglia are intact. No focal cortical lesion is evident. The ventricles are of normal size. No significant extraaxial fluid collection is present. Calvarium and skull base: Within normal limits. Paranasal sinuses: Clear Orbits: Unremarkable CTA HEAD Anterior circulation: The right internal carotid artery is within normal limits from the high cervical segments through the ICA terminus. The left internal carotid artery slightly decreased in caliber compared to the right. There is asymmetric enhancement of the left cavernous sinus which is not present on the right. The terminal ICA is within normal  limits bilaterally. The A1 and M1 segments are normal. ACA and MCA branch vessels are within normal limits bilaterally. Posterior circulation: The right vertebral artery is dominant to the left. PICA origins are visualized and normal. The right posterior cerebral artery is of fetal type. The left posterior cerebral artery originates from the basilar tip. A small P1 segment is present on the right. PCA branch vessels are intact. Venous sinuses: The dural sinuses are patent. The straight sinus deep cerebral veins are within normal limits. The left superior ophthalmic vein is slightly more prominent than on the right. Anatomic variants: Fetal type right posterior cerebral artery. Delayed phase:The postcontrast images demonstrate no pathologic enhancement. IMPRESSION: 1. Asymmetric early enhancement of the left cavernous sinus and slight dilation of the left superior ophthalmic vein consistent with a carotid cavernous fistula. Recommend catheter cerebral arteriogram for further evaluation. 2. No evidence for aneurysm. Electronically Signed   By: San Morelle M.D.   On: 04/17/2016 10:36   Ct Head Wo Contrast  03/31/2016  CLINICAL DATA:  Worsening headaches. RIGHT-sided numbness. No injury. EXAM: CT HEAD WITHOUT CONTRAST TECHNIQUE: Contiguous axial images were obtained from the base of the skull through the vertex without intravenous contrast. COMPARISON:  None. FINDINGS: No evidence for acute infarction, hemorrhage, mass lesion, hydrocephalus, or extra-axial fluid. No atrophy or white  matter disease. Intact calvarium. No acute sinus or mastoid disease. IMPRESSION: Negative exam. If further investigation desired, with regard to the family history of aneurysm, MRA or CTA of the intracranial circulation recommended for further evaluation. Electronically Signed   By: Staci Righter M.D.   On: 03/31/2016 10:38    Labs:  CBC: No results for input(s): WBC, HGB, HCT, PLT in the last 8760 hours.  COAGS: No  results for input(s): INR, APTT in the last 8760 hours.  BMP: No results for input(s): NA, K, CL, CO2, GLUCOSE, BUN, CALCIUM, CREATININE, GFRNONAA, GFRAA in the last 8760 hours.  Invalid input(s): CMP  LIVER FUNCTION TESTS: No results for input(s): BILITOT, AST, ALT, ALKPHOS, PROT, ALBUMIN in the last 8760 hours.  TUMOR MARKERS: No results for input(s): AFPTM, CEA, CA199, CHROMGRNA in the last 8760 hours.  Assessment and Plan:  Hx migraine headaches Change in headaches; severity and number Vision changes; L ear tinnitus Abn CTA Head showing carotid cavernous fistula Now scheduled for further evaluation with cerebral arteriogram Risks and Benefits discussed with the patient including, but not limited to bleeding, infection, vascular injury, contrast induced renal failure, stroke or even death. All of the patient's questions were answered, patient is agreeable to proceed. Consent signed and in chart.  Thank you for this interesting consult.  I greatly enjoyed meeting Baya Pressler and look forward to participating in their care.  A copy of this report was sent to the requesting provider on this date.  Electronically Signed: Chastidy Ranker A 04/30/2016, 6:40 AM   I spent a total of  30 Minutes   in face to face in clinical consultation, greater than 50% of which was counseling/coordinating care for cerebral arteriogram

## 2016-04-30 NOTE — Sedation Documentation (Signed)
Patient is resting comfortably. No complaints at this time. Vitals stable. 

## 2016-06-05 DIAGNOSIS — D239 Other benign neoplasm of skin, unspecified: Secondary | ICD-10-CM | POA: Diagnosis not present

## 2016-06-05 DIAGNOSIS — D229 Melanocytic nevi, unspecified: Secondary | ICD-10-CM | POA: Diagnosis not present

## 2016-06-05 DIAGNOSIS — C44319 Basal cell carcinoma of skin of other parts of face: Secondary | ICD-10-CM | POA: Diagnosis not present

## 2016-06-05 DIAGNOSIS — D485 Neoplasm of uncertain behavior of skin: Secondary | ICD-10-CM | POA: Diagnosis not present

## 2016-06-19 ENCOUNTER — Encounter: Payer: Self-pay | Admitting: Neurology

## 2016-06-19 DIAGNOSIS — C4401 Basal cell carcinoma of skin of lip: Secondary | ICD-10-CM | POA: Diagnosis not present

## 2016-07-01 DIAGNOSIS — M542 Cervicalgia: Secondary | ICD-10-CM | POA: Diagnosis not present

## 2016-07-01 DIAGNOSIS — M47812 Spondylosis without myelopathy or radiculopathy, cervical region: Secondary | ICD-10-CM | POA: Diagnosis not present

## 2016-07-07 ENCOUNTER — Ambulatory Visit: Payer: BLUE CROSS/BLUE SHIELD | Admitting: Neurology

## 2016-07-08 DIAGNOSIS — D239 Other benign neoplasm of skin, unspecified: Secondary | ICD-10-CM | POA: Diagnosis not present

## 2016-07-09 DIAGNOSIS — M47812 Spondylosis without myelopathy or radiculopathy, cervical region: Secondary | ICD-10-CM | POA: Diagnosis not present

## 2016-07-09 DIAGNOSIS — M542 Cervicalgia: Secondary | ICD-10-CM | POA: Diagnosis not present

## 2016-07-15 DIAGNOSIS — M47812 Spondylosis without myelopathy or radiculopathy, cervical region: Secondary | ICD-10-CM | POA: Diagnosis not present

## 2016-07-15 DIAGNOSIS — M542 Cervicalgia: Secondary | ICD-10-CM | POA: Diagnosis not present

## 2016-07-22 DIAGNOSIS — M542 Cervicalgia: Secondary | ICD-10-CM | POA: Diagnosis not present

## 2016-07-22 DIAGNOSIS — M47812 Spondylosis without myelopathy or radiculopathy, cervical region: Secondary | ICD-10-CM | POA: Diagnosis not present

## 2016-07-25 DIAGNOSIS — M542 Cervicalgia: Secondary | ICD-10-CM | POA: Diagnosis not present

## 2016-07-25 DIAGNOSIS — M47812 Spondylosis without myelopathy or radiculopathy, cervical region: Secondary | ICD-10-CM | POA: Diagnosis not present

## 2016-07-28 DIAGNOSIS — M542 Cervicalgia: Secondary | ICD-10-CM | POA: Diagnosis not present

## 2016-07-28 DIAGNOSIS — M47812 Spondylosis without myelopathy or radiculopathy, cervical region: Secondary | ICD-10-CM | POA: Diagnosis not present

## 2016-08-09 DIAGNOSIS — M67912 Unspecified disorder of synovium and tendon, left shoulder: Secondary | ICD-10-CM | POA: Diagnosis not present

## 2016-08-12 DIAGNOSIS — D2362 Other benign neoplasm of skin of left upper limb, including shoulder: Secondary | ICD-10-CM | POA: Diagnosis not present

## 2016-08-27 DIAGNOSIS — J0100 Acute maxillary sinusitis, unspecified: Secondary | ICD-10-CM | POA: Diagnosis not present

## 2016-09-05 DIAGNOSIS — M67912 Unspecified disorder of synovium and tendon, left shoulder: Secondary | ICD-10-CM | POA: Diagnosis not present

## 2016-09-30 DIAGNOSIS — M24572 Contracture, left ankle: Secondary | ICD-10-CM | POA: Diagnosis not present

## 2016-09-30 DIAGNOSIS — M722 Plantar fascial fibromatosis: Secondary | ICD-10-CM | POA: Diagnosis not present

## 2016-10-31 ENCOUNTER — Other Ambulatory Visit: Payer: Self-pay | Admitting: Orthopedic Surgery

## 2016-11-19 DIAGNOSIS — L814 Other melanin hyperpigmentation: Secondary | ICD-10-CM | POA: Diagnosis not present

## 2016-11-19 DIAGNOSIS — D485 Neoplasm of uncertain behavior of skin: Secondary | ICD-10-CM | POA: Diagnosis not present

## 2016-11-19 DIAGNOSIS — L821 Other seborrheic keratosis: Secondary | ICD-10-CM | POA: Diagnosis not present

## 2016-11-19 DIAGNOSIS — Z85828 Personal history of other malignant neoplasm of skin: Secondary | ICD-10-CM | POA: Diagnosis not present

## 2016-11-19 DIAGNOSIS — D225 Melanocytic nevi of trunk: Secondary | ICD-10-CM | POA: Diagnosis not present

## 2016-11-27 ENCOUNTER — Encounter (HOSPITAL_BASED_OUTPATIENT_CLINIC_OR_DEPARTMENT_OTHER): Payer: Self-pay | Admitting: *Deleted

## 2016-11-28 DIAGNOSIS — Z1231 Encounter for screening mammogram for malignant neoplasm of breast: Secondary | ICD-10-CM | POA: Diagnosis not present

## 2016-12-01 ENCOUNTER — Ambulatory Visit (HOSPITAL_BASED_OUTPATIENT_CLINIC_OR_DEPARTMENT_OTHER): Payer: BLUE CROSS/BLUE SHIELD | Admitting: Anesthesiology

## 2016-12-01 ENCOUNTER — Ambulatory Visit (HOSPITAL_BASED_OUTPATIENT_CLINIC_OR_DEPARTMENT_OTHER)
Admission: RE | Admit: 2016-12-01 | Discharge: 2016-12-01 | Disposition: A | Discharge status: Home / Self Care | Payer: BLUE CROSS/BLUE SHIELD | Source: Ambulatory Visit | Attending: Orthopedic Surgery | Admitting: Orthopedic Surgery

## 2016-12-01 ENCOUNTER — Encounter (HOSPITAL_BASED_OUTPATIENT_CLINIC_OR_DEPARTMENT_OTHER): Payer: Self-pay

## 2016-12-01 ENCOUNTER — Encounter (HOSPITAL_BASED_OUTPATIENT_CLINIC_OR_DEPARTMENT_OTHER)
Admission: RE | Disposition: A | Discharge status: Home / Self Care | Payer: Self-pay | Source: Ambulatory Visit | Attending: Orthopedic Surgery

## 2016-12-01 DIAGNOSIS — M75112 Incomplete rotator cuff tear or rupture of left shoulder, not specified as traumatic: Secondary | ICD-10-CM | POA: Diagnosis not present

## 2016-12-01 DIAGNOSIS — Z6833 Body mass index (BMI) 33.0-33.9, adult: Secondary | ICD-10-CM | POA: Insufficient documentation

## 2016-12-01 DIAGNOSIS — E039 Hypothyroidism, unspecified: Secondary | ICD-10-CM | POA: Insufficient documentation

## 2016-12-01 DIAGNOSIS — I10 Essential (primary) hypertension: Secondary | ICD-10-CM | POA: Diagnosis not present

## 2016-12-01 DIAGNOSIS — Z85828 Personal history of other malignant neoplasm of skin: Secondary | ICD-10-CM | POA: Diagnosis not present

## 2016-12-01 DIAGNOSIS — K219 Gastro-esophageal reflux disease without esophagitis: Secondary | ICD-10-CM | POA: Diagnosis not present

## 2016-12-01 DIAGNOSIS — E669 Obesity, unspecified: Secondary | ICD-10-CM | POA: Insufficient documentation

## 2016-12-01 DIAGNOSIS — M7542 Impingement syndrome of left shoulder: Secondary | ICD-10-CM | POA: Diagnosis not present

## 2016-12-01 DIAGNOSIS — M75102 Unspecified rotator cuff tear or rupture of left shoulder, not specified as traumatic: Secondary | ICD-10-CM | POA: Diagnosis not present

## 2016-12-01 DIAGNOSIS — I739 Peripheral vascular disease, unspecified: Secondary | ICD-10-CM | POA: Diagnosis not present

## 2016-12-01 DIAGNOSIS — G8918 Other acute postprocedural pain: Secondary | ICD-10-CM | POA: Diagnosis not present

## 2016-12-01 DIAGNOSIS — G43909 Migraine, unspecified, not intractable, without status migrainosus: Secondary | ICD-10-CM | POA: Diagnosis not present

## 2016-12-01 HISTORY — DX: Malignant (primary) neoplasm, unspecified: C80.1

## 2016-12-01 HISTORY — DX: Gastro-esophageal reflux disease without esophagitis: K21.9

## 2016-12-01 HISTORY — PX: SHOULDER ARTHROSCOPY WITH ROTATOR CUFF REPAIR AND SUBACROMIAL DECOMPRESSION: SHX5686

## 2016-12-01 HISTORY — DX: Hypothyroidism, unspecified: E03.9

## 2016-12-01 SURGERY — SHOULDER ARTHROSCOPY WITH ROTATOR CUFF REPAIR AND SUBACROMIAL DECOMPRESSION
Anesthesia: General | Site: Shoulder | Laterality: Left

## 2016-12-01 MED ORDER — EPHEDRINE SULFATE 50 MG/ML IJ SOLN
INTRAMUSCULAR | Status: DC | PRN
  Administered 2016-12-01: 10 mg via INTRAVENOUS

## 2016-12-01 MED ORDER — CEFAZOLIN SODIUM-DEXTROSE 2-4 GM/100ML-% IV SOLN: 2.0000 g | INTRAVENOUS | Status: DC

## 2016-12-01 MED ORDER — SUCCINYLCHOLINE CHLORIDE 20 MG/ML IJ SOLN
INTRAMUSCULAR | Status: DC | PRN
  Administered 2016-12-01: 100 mg via INTRAVENOUS

## 2016-12-01 MED ORDER — PROMETHAZINE HCL 25 MG/ML IJ SOLN: 6.2500 mg | INTRAMUSCULAR | Status: DC | PRN

## 2016-12-01 MED ORDER — LIDOCAINE HCL (CARDIAC) 20 MG/ML IV SOLN
INTRAVENOUS | Status: DC | PRN
  Administered 2016-12-01: 80 mg via INTRAVENOUS

## 2016-12-01 MED ORDER — MIDAZOLAM HCL 2 MG/2ML IJ SOLN
1.0000 mg | INTRAMUSCULAR | Status: DC | PRN
  Administered 2016-12-01: 2 mg via INTRAVENOUS
  Administered 2016-12-01: 1 mg via INTRAVENOUS
  Administered 2016-12-01: 2 mg via INTRAVENOUS

## 2016-12-01 MED ORDER — FENTANYL CITRATE (PF) 100 MCG/2ML IJ SOLN
50.0000 ug | INTRAMUSCULAR | Status: DC | PRN
  Administered 2016-12-01: 50 ug via INTRAVENOUS

## 2016-12-01 MED ORDER — MIDAZOLAM HCL 2 MG/2ML IJ SOLN
INTRAMUSCULAR | Status: AC
  Filled 2016-12-01: qty 2

## 2016-12-01 MED ORDER — OXYCODONE-ACETAMINOPHEN 5-325 MG PO TABS: 1.0000 | ORAL_TABLET | ORAL | 0 refills | Status: DC | PRN

## 2016-12-01 MED ORDER — ROPIVACAINE HCL 7.5 MG/ML IJ SOLN
INTRAMUSCULAR | Status: DC | PRN
  Administered 2016-12-01: 20 mL via PERINEURAL

## 2016-12-01 MED ORDER — FENTANYL CITRATE (PF) 100 MCG/2ML IJ SOLN: 25.0000 ug | INTRAMUSCULAR | Status: DC | PRN

## 2016-12-01 MED ORDER — LIDOCAINE HCL 4 % EX SOLN
CUTANEOUS | Status: DC | PRN
  Administered 2016-12-01: 3 mL via TOPICAL

## 2016-12-01 MED ORDER — POVIDONE-IODINE 7.5 % EX SOLN: Freq: Once | CUTANEOUS | Status: DC

## 2016-12-01 MED ORDER — SODIUM CHLORIDE 0.9 % IR SOLN
Status: DC | PRN
  Administered 2016-12-01: 4500 mL

## 2016-12-01 MED ORDER — CEFAZOLIN SODIUM-DEXTROSE 2-4 GM/100ML-% IV SOLN
INTRAVENOUS | Status: AC
  Filled 2016-12-01: qty 100

## 2016-12-01 MED ORDER — SCOPOLAMINE 1 MG/3DAYS TD PT72: 1.0000 | MEDICATED_PATCH | Freq: Once | TRANSDERMAL | Status: DC | PRN

## 2016-12-01 MED ORDER — PROPOFOL 10 MG/ML IV BOLUS
INTRAVENOUS | Status: DC | PRN
  Administered 2016-12-01: 150 mg via INTRAVENOUS

## 2016-12-01 MED ORDER — FENTANYL CITRATE (PF) 100 MCG/2ML IJ SOLN
INTRAMUSCULAR | Status: AC
  Filled 2016-12-01: qty 2

## 2016-12-01 MED ORDER — ONDANSETRON HCL 4 MG/2ML IJ SOLN
INTRAMUSCULAR | Status: DC | PRN
  Administered 2016-12-01: 4 mg via INTRAVENOUS

## 2016-12-01 MED ORDER — DOCUSATE SODIUM 100 MG PO CAPS: 100.0000 mg | ORAL_CAPSULE | Freq: Three times a day (TID) | ORAL | 0 refills | Status: DC | PRN

## 2016-12-01 MED ORDER — DEXAMETHASONE SODIUM PHOSPHATE 4 MG/ML IJ SOLN
INTRAMUSCULAR | Status: DC | PRN
  Administered 2016-12-01: 10 mg via INTRAVENOUS

## 2016-12-01 MED ORDER — LACTATED RINGERS IV SOLN
INTRAVENOUS | Status: DC
  Administered 2016-12-01 (×3): via INTRAVENOUS

## 2016-12-01 SURGICAL SUPPLY — 84 items
ANCHOR PEEK 4.75X19.1 SWLK C (Anchor) ×4 IMPLANT
BENZOIN TINCTURE PRP APPL 2/3 (GAUZE/BANDAGES/DRESSINGS) IMPLANT
BLADE CLIPPER SURG (BLADE) IMPLANT
BLADE SURG 15 STRL LF DISP TIS (BLADE) IMPLANT
BLADE SURG 15 STRL SS (BLADE)
BUR OVAL 4.0 (BURR) ×4 IMPLANT
CANNULA 5.75X71 LONG (CANNULA) ×4 IMPLANT
CANNULA TWIST IN 8.25X7CM (CANNULA) ×4 IMPLANT
CHLORAPREP W/TINT 26ML (MISCELLANEOUS) ×4 IMPLANT
CLOSURE WOUND 1/2 X4 (GAUZE/BANDAGES/DRESSINGS)
DECANTER SPIKE VIAL GLASS SM (MISCELLANEOUS) IMPLANT
DERMABOND ADVANCED (GAUZE/BANDAGES/DRESSINGS)
DERMABOND ADVANCED .7 DNX12 (GAUZE/BANDAGES/DRESSINGS) IMPLANT
DRAPE IMP U-DRAPE 54X76 (DRAPES) ×4 IMPLANT
DRAPE INCISE IOBAN 66X45 STRL (DRAPES) ×4 IMPLANT
DRAPE STERI 35X30 U-POUCH (DRAPES) ×4 IMPLANT
DRAPE SURG 17X23 STRL (DRAPES) ×4 IMPLANT
DRAPE U-SHAPE 47X51 STRL (DRAPES) ×4 IMPLANT
DRAPE U-SHAPE 76X120 STRL (DRAPES) ×8 IMPLANT
DRSG PAD ABDOMINAL 8X10 ST (GAUZE/BANDAGES/DRESSINGS) ×4 IMPLANT
ELECT REM PT RETURN 9FT ADLT (ELECTROSURGICAL) ×4
ELECTRODE REM PT RTRN 9FT ADLT (ELECTROSURGICAL) ×2 IMPLANT
GAUZE SPONGE 4X4 12PLY STRL (GAUZE/BANDAGES/DRESSINGS) ×4 IMPLANT
GAUZE SPONGE 4X4 16PLY XRAY LF (GAUZE/BANDAGES/DRESSINGS) IMPLANT
GAUZE XEROFORM 1X8 LF (GAUZE/BANDAGES/DRESSINGS) ×4 IMPLANT
GLOVE BIO SURGEON STRL SZ7 (GLOVE) ×4 IMPLANT
GLOVE BIO SURGEON STRL SZ7.5 (GLOVE) ×4 IMPLANT
GLOVE BIOGEL PI IND STRL 7.0 (GLOVE) ×4 IMPLANT
GLOVE BIOGEL PI IND STRL 8 (GLOVE) ×2 IMPLANT
GLOVE BIOGEL PI INDICATOR 7.0 (GLOVE) ×4
GLOVE BIOGEL PI INDICATOR 8 (GLOVE) ×2
GLOVE ECLIPSE 6.5 STRL STRAW (GLOVE) ×4 IMPLANT
GOWN STRL REUS W/ TWL LRG LVL3 (GOWN DISPOSABLE) ×4 IMPLANT
GOWN STRL REUS W/ TWL XL LVL3 (GOWN DISPOSABLE) ×2 IMPLANT
GOWN STRL REUS W/TWL LRG LVL3 (GOWN DISPOSABLE) ×4
GOWN STRL REUS W/TWL XL LVL3 (GOWN DISPOSABLE) ×2
IV NS IRRIG 3000ML ARTHROMATIC (IV SOLUTION) ×8 IMPLANT
LASSO CRESCENT QUICKPASS (SUTURE) ×4 IMPLANT
MANIFOLD NEPTUNE II (INSTRUMENTS) ×4 IMPLANT
NDL SUT 6 .5 CRC .975X.05 MAYO (NEEDLE) IMPLANT
NEEDLE 1/2 CIR CATGUT .05X1.09 (NEEDLE) IMPLANT
NEEDLE MAYO TAPER (NEEDLE)
NEEDLE SCORPION MULTI FIRE (NEEDLE) IMPLANT
NS IRRIG 1000ML POUR BTL (IV SOLUTION) IMPLANT
PACK ARTHROSCOPY DSU (CUSTOM PROCEDURE TRAY) ×4 IMPLANT
PACK BASIN DAY SURGERY FS (CUSTOM PROCEDURE TRAY) ×4 IMPLANT
PENCIL BUTTON HOLSTER BLD 10FT (ELECTRODE) IMPLANT
PROBE BIPOLAR ATHRO 135MM 90D (MISCELLANEOUS) ×4 IMPLANT
RESECTOR FULL RADIUS 4.2MM (BLADE) ×4 IMPLANT
RESTRAINT HEAD UNIVERSAL NS (MISCELLANEOUS) ×4 IMPLANT
SHEET MEDIUM DRAPE 40X70 STRL (DRAPES) IMPLANT
SLEEVE SCD COMPRESS KNEE MED (MISCELLANEOUS) ×4 IMPLANT
SLING ARM FOAM STRAP LRG (SOFTGOODS) IMPLANT
SLING ARM IMMOBILIZER LRG (SOFTGOODS) ×4 IMPLANT
SLING ARM IMMOBILIZER MED (SOFTGOODS) IMPLANT
SLING ARM MED ADULT FOAM STRAP (SOFTGOODS) IMPLANT
SLING ARM XL FOAM STRAP (SOFTGOODS) IMPLANT
SPONGE LAP 4X18 X RAY DECT (DISPOSABLE) IMPLANT
STRIP CLOSURE SKIN 1/2X4 (GAUZE/BANDAGES/DRESSINGS) IMPLANT
SUCTION FRAZIER HANDLE 10FR (MISCELLANEOUS)
SUCTION TUBE FRAZIER 10FR DISP (MISCELLANEOUS) IMPLANT
SUPPORT WRAP ARM LG (MISCELLANEOUS) IMPLANT
SUT BONE WAX W31G (SUTURE) IMPLANT
SUT ETHILON 3 0 PS 1 (SUTURE) ×4 IMPLANT
SUT ETHILON 4 0 PS 2 18 (SUTURE) IMPLANT
SUT FIBERWIRE #2 38 T-5 BLUE (SUTURE)
SUT MNCRL AB 4-0 PS2 18 (SUTURE) IMPLANT
SUT PDS AB 0 CT 36 (SUTURE) IMPLANT
SUT PROLENE 3 0 PS 2 (SUTURE) IMPLANT
SUT TIGER TAPE 7 IN WHITE (SUTURE) IMPLANT
SUT VIC AB 0 CT1 27 (SUTURE)
SUT VIC AB 0 CT1 27XBRD ANBCTR (SUTURE) IMPLANT
SUT VIC AB 2-0 SH 27 (SUTURE)
SUT VIC AB 2-0 SH 27XBRD (SUTURE) IMPLANT
SUTURE FIBERWR #2 38 T-5 BLUE (SUTURE) IMPLANT
SYR BULB 3OZ (MISCELLANEOUS) IMPLANT
TAPE FIBER 2MM 7IN #2 BLUE (SUTURE) ×4 IMPLANT
TOWEL OR 17X24 6PK STRL BLUE (TOWEL DISPOSABLE) ×4 IMPLANT
TOWEL OR NON WOVEN STRL DISP B (DISPOSABLE) ×4 IMPLANT
TUBE CONNECTING 20'X1/4 (TUBING) ×1
TUBE CONNECTING 20X1/4 (TUBING) ×3 IMPLANT
TUBING ARTHROSCOPY IRRIG 16FT (MISCELLANEOUS) ×4 IMPLANT
WATER STERILE IRR 1000ML POUR (IV SOLUTION) ×4 IMPLANT
YANKAUER SUCT BULB TIP NO VENT (SUCTIONS) IMPLANT

## 2016-12-01 NOTE — Anesthesia Procedure Notes (Signed)
Anesthesia Regional Block: Narrative:       

## 2016-12-01 NOTE — Op Note (Signed)
Procedure(s): SHOULDER ARTHROSCOPY WITH DEBRIDEMENT ROTATOR CUFF TEAR, SUBACROMIAL DECOMPRESSION Procedure Note  Sarah Hayes female 50 y.o. 12/01/2016  Procedure(s) and Anesthesia Type: #1 left shoulder arthroscopic rotator cuff repair (subscapularis ) #2 left shoulder arthroscopic subacromial decompression     Surgeon(s) and Role:    * Tania Ade, MD - Primary     Surgeon: Nita Sells   Assistants: Jeanmarie Hubert PA-C (Sarah Hayes was present and scrubbed throughout the procedure and was essential in positioning, assisting with the camera and instrumentation,, and closure)  Anesthesia: General endotracheal anesthesia with preoperative interscalene block given by the attending anesthesiologist    Procedure Detail  SHOULDER ARTHROSCOPY WITH DEBRIDEMENT ROTATOR CUFF TEAR, SUBACROMIAL DECOMPRESSION  Estimated Blood Loss: Min         Drains: none  Blood Given: none         Specimens: none        Complications:  * No complications entered in OR log *         Disposition: PACU - hemodynamically stable.         Condition: stable    Procedure:   INDICATIONS FOR SURGERY: The patient is 50 y.o. female who has had a long history over one year of left shoulder pain which has failed conservative management. She had unfavorable acromial anatomy and was felt to have a partial-thickness rotator cuff tear. Indicated for surgery to decrease pain and restore function.  OPERATIVE FINDINGS: Examination under anesthesia: No stiffness or instability.  DESCRIPTION OF PROCEDURE: The patient was identified in preoperative  holding area where I personally marked the operative site after  verifying site, side, and procedure with the patient. An interscalene block was given by the attending anesthesiologist the holding area.  The patient was taken back to the operating room where general anesthesia was induced without complication and was placed in the beach-chair position  with the back  elevated about 60 degrees and all extremities and head and neck carefully padded and  positioned.   The left upper extremity was then prepped and  draped in a standard sterile fashion. The appropriate time-out  procedure was carried out. The patient did receive IV antibiotics  within 30 minutes of incision.   A small posterior portal incision was made and the arthroscope was introduced into the joint. An anterior portal was then established above the subscapularis using needle localization. Small cannula was placed anteriorly. Diagnostic arthroscopy was then carried.  She was noted to have some labral fraying anterior and superior, debrided with a shaver back to healthy bleeding labrum. The biceps anchor and biceps tendon were intact. The supraspinatus and infraspinatus were intact. The subscapularis was noted to have some very high-grade partial tearing of the upper border with exposed tuberosity. The upper tendinous portion was separated from the tuberosity but not significantly retracted. This was debrided with shaver and felt to be involved enough to benefit from formal repair. Therefore a large cannula was placed anteriorly and the small cannula was moved to a high lateral interval position behind the biceps. The tuberosity was debrided down to bleeding bone with a bur and the repair was carried out in the upper tendinous border of the subscapularis with a fiber tape in an inverted mattress configuration. This was brought down into a 4.75 peek swivel lock anchor into the lesser tuberosity. This allowed anatomic reconstruction of the subscapularis.  The arthroscope was then introduced into the subacromial space a standard lateral portal was established with needle localization. The shaver was used  through the lateral portal to perform extensive bursectomy. Coracoacromial ligament was examined and found to be severely frayed indicating chronic impingement.  The bursal side of the  rotator cuff was frayed and debrided back to healthy tendon but there is no exposed tuberosity.  The coracoacromial ligament was taken down off the anterior acromion with the ArthroCare exposing a large hooked3 anterior acromial spur. A high-speed bur was then used through the lateral portal to take down the anterior acromial spur from lateral to medial in a standard acromioplasty.  The acromioplasty was also viewed from the lateral portal and the bur was used as necessary to ensure that the acromion was completely flat from posterior to anterior.  The arthroscopic equipment was removed from the joint and the portals were closed with 3-0 nylon in an interrupted fashion. Sterile dressings were then applied including Xeroform 4 x 4's ABDs and tape. The patient was then allowed to awaken from general anesthesia, placed in a sling, transferred to the stretcher and taken to the recovery room in stable condition.   POSTOPERATIVE PLAN: The patient will be discharged home today and will followup in one week for suture removal and wound check.  She will follow the standard cuff protocol but we will not externally rotated beyond neutral for the first 6 weeks.

## 2016-12-01 NOTE — Anesthesia Postprocedure Evaluation (Signed)
Anesthesia Post Note  Patient: Sarah Hayes  Procedure(s) Performed: Procedure(s) (LRB): SHOULDER ARTHROSCOPY WITH DEBRIDEMENT ROTATOR CUFF TEAR, SUBACROMIAL DECOMPRESSION (Left)  Patient location during evaluation: PACU Anesthesia Type: General and Regional Level of consciousness: awake and alert Pain management: pain level controlled Vital Signs Assessment: post-procedure vital signs reviewed and stable Respiratory status: spontaneous breathing, nonlabored ventilation, respiratory function stable and patient connected to nasal cannula oxygen Cardiovascular status: blood pressure returned to baseline and stable Postop Assessment: no signs of nausea or vomiting Anesthetic complications: no       Last Vitals:  Vitals:   12/01/16 1402 12/01/16 1427  BP:  130/65  Pulse: 74 66  Resp: 15 16  Temp:  36.6 C    Last Pain:  Vitals:   12/01/16 1427  TempSrc:   PainSc: 0-No pain                 Catalina Gravel

## 2016-12-01 NOTE — Transfer of Care (Signed)
Immediate Anesthesia Transfer of Care Note  Patient: Sarah Hayes  Procedure(s) Performed: Procedure(s) with comments: SHOULDER ARTHROSCOPY WITH DEBRIDEMENT ROTATOR CUFF TEAR, SUBACROMIAL DECOMPRESSION (Left) - SHOULDER ARTHROSCOPY WITH DEBRIDEMENT ROTATOR CUFF TEAR, SUBACROMIAL DECOMPRESSION  Patient Location: PACU  Anesthesia Type:GA combined with regional for post-op pain  Level of Consciousness: sedated  Airway & Oxygen Therapy: Patient Spontanous Breathing and Patient connected to face mask oxygen  Post-op Assessment: Report given to RN and Post -op Vital signs reviewed and stable  Post vital signs: Reviewed and stable  Last Vitals:  Vitals:   12/01/16 1140 12/01/16 1158  BP:  (!) 139/97  Pulse: (!) 142   Resp: 18   Temp:      Last Pain:  Vitals:   12/01/16 1022  TempSrc: Oral         Complications: No apparent anesthesia complications

## 2016-12-01 NOTE — Anesthesia Preprocedure Evaluation (Addendum)
Anesthesia Evaluation  Patient identified by MRN, date of birth, ID band Patient awake    Reviewed: Allergy & Precautions, NPO status , Patient's Chart, lab work & pertinent test results  Airway Mallampati: III  TM Distance: >3 FB Neck ROM: Full    Dental  (+) Teeth Intact, Dental Advisory Given   Pulmonary neg pulmonary ROS,    Pulmonary exam normal breath sounds clear to auscultation       Cardiovascular + Peripheral Vascular Disease  Normal cardiovascular exam Rhythm:Regular Rate:Normal     Neuro/Psych  Headaches, negative psych ROS   GI/Hepatic Neg liver ROS, GERD  Medicated and Controlled,  Endo/Other  Hypothyroidism Obesity   Renal/GU negative Renal ROS     Musculoskeletal negative musculoskeletal ROS (+)   Abdominal   Peds  Hematology negative hematology ROS (+)   Anesthesia Other Findings Day of surgery medications reviewed with the patient.  Reproductive/Obstetrics                            Anesthesia Physical Anesthesia Plan  ASA: II  Anesthesia Plan: General   Post-op Pain Management: GA combined w/ Regional for post-op pain   Induction: Intravenous  Airway Management Planned: Oral ETT  Additional Equipment:   Intra-op Plan:   Post-operative Plan: Extubation in OR  Informed Consent: I have reviewed the patients History and Physical, chart, labs and discussed the procedure including the risks, benefits and alternatives for the proposed anesthesia with the patient or authorized representative who has indicated his/her understanding and acceptance.   Dental advisory given  Plan Discussed with: CRNA  Anesthesia Plan Comments: (Risks/benefits of general anesthesia discussed with patient including risk of damage to teeth, lips, gum, and tongue, nausea/vomiting, allergic reactions to medications, and the possibility of heart attack, stroke and death.  All patient  questions answered.  Patient wishes to proceed.  Discussed risks and benefits of interscalene block including failure, bleeding, infection, nerve damage, weakness, shortness of breath, pneumothorax. Questions answered. Patient consents to block. )        Anesthesia Quick Evaluation

## 2016-12-01 NOTE — Anesthesia Procedure Notes (Signed)
Procedure Name: Intubation Date/Time: 12/01/2016 12:24 PM Performed by: Maryella Shivers Pre-anesthesia Checklist: Patient identified, Emergency Drugs available, Suction available and Patient being monitored Patient Re-evaluated:Patient Re-evaluated prior to inductionOxygen Delivery Method: Circle system utilized Preoxygenation: Pre-oxygenation with 100% oxygen Intubation Type: IV induction Ventilation: Mask ventilation without difficulty Laryngoscope Size: Mac and 3 Grade View: Grade III Tube type: Oral Tube size: 7.0 mm Number of attempts: 1 Airway Equipment and Method: Stylet and Oral airway Placement Confirmation: ETT inserted through vocal cords under direct vision,  positive ETCO2 and breath sounds checked- equal and bilateral Secured at: 20 cm Tube secured with: Tape Dental Injury: Teeth and Oropharynx as per pre-operative assessment

## 2016-12-01 NOTE — H&P (Signed)
Sarah Hayes is an 50 y.o. female.   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  Failed conservative treatment.  Past Medical History:  Diagnosis Date  . Cancer ([place])    basal cell on legs and face  . GERD (gastroesophageal reflux disease)   . Hypothyroidism   . Migraine    migraines  . Thyroid disease     Past Surgical History:  Procedure Laterality Date  . BLADDER SUSPENSION  2011  . TONSILLECTOMY      Family History  Problem Relation Age of Onset  . Hypertension Mother   . Hypertension Father   . Diabetes Father   . Migraines Neg Hx    Social History:  reports that she has never smoked. She has never used smokeless tobacco. She reports that she does not drink alcohol or use drugs.  Allergies: No Known Allergies  Medications Prior to Admission  Medication Sig Dispense Refill  . levothyroxine (SYNTHROID, LEVOTHROID) 125 MCG tablet Take 125 mcg by mouth daily before breakfast.    . methocarbamol (ROBAXIN) 500 MG tablet Take 500 mg by mouth every 8 (eight) hours.  1  . Multiple Vitamin (MULTI-VITAMINS PO) Take 1 tablet by mouth daily.     Marland Kitchen omeprazole (PRILOSEC) 40 MG capsule Take 40 mg by mouth daily.    . SUMAtriptan (IMITREX) 100 MG tablet Take 100 mg by mouth every 2 (two) hours as needed for migraine.     . topiramate (TOPAMAX) 100 MG tablet Take 100 mg by mouth 2 (two) times daily.    . nortriptyline (PAMELOR) 25 MG capsule Take 1 capsule (25 mg total) by mouth at bedtime. 30 capsule 12  . SUMAtriptan Succinate (ZEMBRACE SYMTOUCH) 3 MG/0.5ML SOAJ Inject 1 Dose into the skin every 1 hour x 4 doses. 10 pen 12    No results found for this or any previous visit (from the past 48 hour(s)). No results found.  Review of Systems  All other systems reviewed and are negative.   Blood pressure (!) 139/97, pulse (!) 142, temperature 98 F (36.7 C), temperature source Oral, resp. rate 18, height 5\' 5"  (1.651  m), weight 91.8 kg (202 lb 6 oz), last menstrual period 09/03/2016, SpO2 94 %. Physical Exam  Constitutional: She is oriented to person, place, and time. She appears well-developed and well-nourished.  HENT:  Head: Atraumatic.  Eyes: EOM are normal.  Cardiovascular: Intact distal pulses.   Respiratory: Effort normal.  Musculoskeletal:  L shoulder pain with RC/Impingement testing.  Neurological: She is alert and oriented to person, place, and time.  Skin: Skin is warm and dry.  Psychiatric: She has a normal mood and affect.     Assessment/Plan  L shoulder partial thickness RCT with unfavorable acromial anatomy.  Failed conservative treatment. Plan L arth RC debridement vs repair, SAD Risks / benefits of surgery discussed Consent on chart  NPO for OR Preop antibiotics   Nita Sells, MD 12/01/2016, 12:08 PM

## 2016-12-01 NOTE — Anesthesia Procedure Notes (Signed)
Anesthesia Regional Block:  Interscalene brachial plexus block  Pre-Anesthetic Checklist: ,, timeout performed, Correct Patient, Correct Site, Correct Laterality, Correct Procedure, Correct Position, site marked, Risks and benefits discussed,  Surgical consent,  Pre-op evaluation,  At surgeon's request and post-op pain management  Laterality: Left  Prep: chloraprep       Needles:  Injection technique: Single-shot  Needle Type: Echogenic Stimulator Needle     Needle Length: 5cm 5 cm Needle Gauge: 22 and 22 G    Additional Needles:  Procedures: ultrasound guided (picture in chart) Interscalene brachial plexus block Narrative:  Start time: 12/01/2016 10:56 AM End time: 12/01/2016 11:06 AM Injection made incrementally with aspirations every 5 mL.  Performed by: Personally  Anesthesiologist: Catalina Gravel  Additional Notes: Functioning IV was confirmed and monitors were applied.  A 75mm 22ga Arrow echogenic stimulator needle was used. Sterile prep and drape, hand hygiene, and sterile gloves were used.  Negative aspiration and negative test dose prior to incremental administration of local anesthetic. The patient tolerated the procedure well.  Ultrasound guidance: relevent anatomy identified, needle position confirmed, local anesthetic spread visualized around nerve(s), vascular puncture avoided.  Image printed for medical record.

## 2016-12-01 NOTE — Progress Notes (Signed)
Assisted Dr. Gifford Shave with left, ultrasound guided, interscalene  block. Side rails up, monitors on throughout procedure. See vital signs in flow sheet. Tolerated Procedure well.

## 2016-12-01 NOTE — Discharge Instructions (Signed)
Discharge Instructions after Arthroscopic Shoulder Repair ° ° °A sling has been provided for you. Remain in your sling at all times. This includes sleeping in your sling.  °Use ice on the shoulder intermittently over the first 48 hours after surgery.  °Pain medicine has been prescribed for you.  °Use your medicine liberally over the first 48 hours, and then you can begin to taper your use. You may take Extra Strength Tylenol or Tylenol only in place of the pain pills. DO NOT take ANY nonsteroidal anti-inflammatory pain medications: Advil, Motrin, Ibuprofen, Aleve, Naproxen, or Narprosyn.  °You may remove your dressing after two days. If the incision sites are still moist, place a Band-Aid over the moist site(s). Change Band-Aids daily until dry.  °You may shower 5 days after surgery. The incisions CANNOT get wet prior to 5 days. Simply allow the water to wash over the site and then pat dry. Do not rub the incisions. Make sure your axilla (armpit) is completely dry after showering.  °Take one aspirin a day for 2 weeks after surgery, unless you have an aspirin sensitivity/ allergy or asthma. ° ° °Please call [phone] during normal business hours or [phone] after hours for any problems. Including the following: ° °- excessive redness of the incisions °- drainage for more than 4 days °- fever of more than 101.5 F ° °*Please note that pain medications will not be refilled after hours or on weekends. ° °Regional Anesthesia Blocks ° °1. Numbness or the inability to move the "blocked" extremity may last from 3-48 hours after placement. The length of time depends on the medication injected and your individual response to the medication. If the numbness is not going away after 48 hours, call your surgeon. ° °2. The extremity that is blocked will need to be protected until the numbness is gone and the  Strength has returned. Because you cannot feel it, you will need to take extra care to avoid injury. Because it may  be weak, you may have difficulty moving it or using it. You may not know what position it is in without looking at it while the block is in effect. ° °3. For blocks in the legs and feet, returning to weight bearing and walking needs to be done carefully. You will need to wait until the numbness is entirely gone and the strength has returned. You should be able to move your leg and foot normally before you try and bear weight or walk. You will need someone to be with you when you first try to ensure you do not fall and possibly risk injury. ° °4. Bruising and tenderness at the needle site are common side effects and will resolve in a few days. ° °5. Persistent numbness or new problems with movement should be communicated to the surgeon or the [facility] ([phone])/ [facility] ([phone]). ° °Post Anesthesia Home Care Instructions ° °Activity: °Get plenty of rest for the remainder of the day. A responsible adult should stay with you for 24 hours following the procedure.  °For the next 24 hours, DO NOT: °-Drive a car °-Operate machinery °-Drink alcoholic beverages °-Take any medication unless instructed by your physician °-Make any legal decisions or sign important papers. ° °Meals: °Start with liquid foods such as gelatin or soup. Progress to regular foods as tolerated. Avoid greasy, spicy, heavy foods. If nausea and/or vomiting occur, drink only clear liquids until the nausea and/or vomiting subsides. Call your physician if vomiting continues. ° °  Special Instructions/Symptoms: °Your throat may feel dry or sore from the anesthesia or the breathing tube placed in your throat during surgery. If this causes discomfort, gargle with warm salt water. The discomfort should disappear within 24 hours. ° °If you had a scopolamine patch placed behind your ear for the management of post- operative nausea and/or vomiting: ° °1. The medication in the patch is effective for 72 hours, after which it  should be removed.  Wrap patch in a tissue and discard in the trash. Wash hands thoroughly with soap and water. °2. You may remove the patch earlier than 72 hours if you experience unpleasant side effects which may include dry mouth, dizziness or visual disturbances. °3. Avoid touching the patch. Wash your hands with soap and water after contact with the patch. °  ° ° °

## 2016-12-02 ENCOUNTER — Encounter (HOSPITAL_BASED_OUTPATIENT_CLINIC_OR_DEPARTMENT_OTHER): Payer: Self-pay | Admitting: Orthopedic Surgery

## 2016-12-10 DIAGNOSIS — M67912 Unspecified disorder of synovium and tendon, left shoulder: Secondary | ICD-10-CM | POA: Diagnosis not present

## 2016-12-15 ENCOUNTER — Encounter: Payer: Self-pay | Admitting: *Deleted

## 2016-12-15 ENCOUNTER — Emergency Department (INDEPENDENT_AMBULATORY_CARE_PROVIDER_SITE_OTHER)
Admission: EM | Admit: 2016-12-15 | Discharge: 2016-12-15 | Disposition: A | Discharge status: Home / Self Care | Payer: BLUE CROSS/BLUE SHIELD | Source: Home / Self Care | Attending: Family Medicine | Admitting: Family Medicine

## 2016-12-15 DIAGNOSIS — R300 Dysuria: Secondary | ICD-10-CM | POA: Diagnosis not present

## 2016-12-15 DIAGNOSIS — N3090 Cystitis, unspecified without hematuria: Secondary | ICD-10-CM

## 2016-12-15 LAB — POCT URINALYSIS DIP (MANUAL ENTRY)
BILIRUBIN UA: NEGATIVE
BILIRUBIN UA: NEGATIVE
Glucose, UA: NEGATIVE
Nitrite, UA: NEGATIVE
Spec Grav, UA: 1.025 (ref 1.005–1.03)
Urobilinogen, UA: 0.2 (ref 0–1)
pH, UA: 7 (ref 5–8)

## 2016-12-15 MED ORDER — NITROFURANTOIN MONOHYD MACRO 100 MG PO CAPS: 100.0000 mg | ORAL_CAPSULE | Freq: Two times a day (BID) | ORAL | 0 refills | Status: DC

## 2016-12-15 NOTE — ED Triage Notes (Signed)
Pt c/o urinary urgency, dysuria, and lower abd discomfort x 3 days. Denies fever.

## 2016-12-15 NOTE — ED Provider Notes (Signed)
[facility]    CSN: [id number] Arrival date & time: 12/15/16  1945     History   Chief Complaint Chief Complaint  Patient presents with  . Dysuria    HPI Sarah Hayes is a 51 y.o. female.   Patient complains of 3 day history of urinary frequency, urgency, and nocturia.   The history is provided by the patient.  Dysuria  Pain quality:  Burning Pain severity:  Mild Onset quality:  Gradual Duration:  3 days Timing:  Constant Progression:  Worsening Chronicity:  New Recent urinary tract infections: no   Relieved by:  None tried Worsened by:  Nothing Ineffective treatments:  None tried Urinary symptoms: frequent urination and hesitancy   Urinary symptoms: no discolored urine, no foul-smelling urine, no hematuria and no bladder incontinence   Associated symptoms: no abdominal pain, no fever, no flank pain, no genital lesions, no nausea, no vaginal discharge and no vomiting     Past Medical History:  Diagnosis Date  . Cancer ([place])    basal cell on legs and face  . GERD (gastroesophageal reflux disease)   . Hypothyroidism   . Migraine    migraines  . Thyroid disease     Patient Active Problem List   Diagnosis Date Noted  . 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 Chronic migraine w/o aura w/o status migrainosus, not intractable 04/12/2016  . HTN (hypertension) 04/12/2016  . Hypothyroidism 04/12/2016    Past Surgical History:  Procedure Laterality Date  . BLADDER SUSPENSION  2011  . SHOULDER ARTHROSCOPY WITH ROTATOR CUFF REPAIR AND SUBACROMIAL DECOMPRESSION Left 12/01/2016   Procedure: SHOULDER ARTHROSCOPY WITH DEBRIDEMENT ROTATOR CUFF TEAR, SUBACROMIAL DECOMPRESSION;  Surgeon: Tania Ade, MD;  Location: [facility];  Service: Orthopedics;  Laterality: Left;  SHOULDER ARTHROSCOPY WITH DEBRIDEMENT ROTATOR CUFF TEAR, SUBACROMIAL DECOMPRESSION  . TONSILLECTOMY      OB History    No data available       Home  Medications    Prior to Admission medications   Medication Sig Start Date End Date Taking? Authorizing Provider  esomeprazole (NEXIUM) 40 MG capsule Take 40 mg by mouth daily at 12 noon.   Yes Historical Provider, MD  levothyroxine (SYNTHROID, LEVOTHROID) 125 MCG tablet Take 125 mcg by mouth daily before breakfast.   Yes Historical Provider, MD  Multiple Vitamin (MULTI-VITAMINS PO) Take 1 tablet by mouth daily.    Yes Historical Provider, MD  topiramate (TOPAMAX) 100 MG tablet Take 100 mg by mouth 2 (two) times daily.   Yes Historical Provider, MD  docusate sodium (COLACE) 100 MG capsule Take 1 capsule (100 mg total) by mouth 3 (three) times daily as needed. 12/01/16   Grier Mitts, PA-C  methocarbamol (ROBAXIN) 500 MG tablet Take 500 mg by mouth every 8 (eight) hours. 04/04/16   Historical Provider, MD  nitrofurantoin, macrocrystal-monohydrate, (MACROBID) 100 MG capsule Take 1 capsule (100 mg total) by mouth 2 (two) times daily. Take with food. 12/15/16   Kandra Nicolas, MD  nortriptyline (PAMELOR) 25 MG capsule Take 1 capsule (25 mg total) by mouth at bedtime. 04/04/16   Melvenia Beam, MD  oxyCODONE-acetaminophen (ROXICET) 5-325 MG tablet Take 1-2 tablets by mouth every 4 (four) hours as needed for severe pain. 12/01/16   Grier Mitts, PA-C  SUMAtriptan (IMITREX) 100 MG tablet Take 100 mg by mouth every 2 (two) hours as needed for migraine.  04/10/16   Historical Provider, MD  SUMAtriptan Succinate (ZEMBRACE SYMTOUCH) 3 MG/0.5ML SOAJ  Inject 1 Dose into the skin every 1 hour x 4 doses. 04/04/16   Melvenia Beam, MD    Family History Family History  Problem Relation Age of Onset  . Hypertension Mother   . Hypertension Father   . Diabetes Father   . Migraines Neg Hx     Social History Social History  Substance Use Topics  . Smoking status: Never Smoker  . Smokeless tobacco: Never Used  . Alcohol use No     Allergies   Patient has no known allergies.   Review of  Systems Review of Systems  Constitutional: Negative for fever.  Gastrointestinal: Negative for abdominal pain, nausea and vomiting.  Genitourinary: Positive for dysuria. Negative for flank pain and vaginal discharge.  All other systems reviewed and are negative.    Physical Exam Triage Vital Signs ED Triage Vitals  Enc Vitals Group     BP 12/15/16 2003 147/83     Pulse Rate 12/15/16 2003 85     Resp 12/15/16 2003 16     Temp 12/15/16 2003 98.1 F (36.7 C)     Temp Source 12/15/16 2003 Oral     SpO2 12/15/16 2003 100 %     Weight 12/15/16 2004 203 lb (92.1 kg)     Height 12/15/16 2004 5\' 6"  (1.676 m)     Head Circumference --      Peak Flow --      Pain Score 12/15/16 2006 0     Pain Loc --      Pain Edu? --      Excl. in [place]? --    No data found.   Updated Vital Signs BP 147/83 (BP Location: Left Arm)   Pulse 85   Temp 98.1 F (36.7 C) (Oral)   Resp 16   Ht 5\' 6"  (1.676 m)   Wt 203 lb (92.1 kg)   LMP 09/03/2016 (Exact Date) Comment: Varies none since sept  SpO2 100%   BMI 32.77 kg/m   Visual Acuity Right Eye Distance:   Left Eye Distance:   Bilateral Distance:    Right Eye Near:   Left Eye Near:    Bilateral Near:     Physical Exam Nursing notes and Vital Signs reviewed. Appearance:  Patient appears stated age, and in no acute distress.    Eyes:  Pupils are equal, round, and reactive to light and accomodation.  Extraocular movement is intact.  Conjunctivae are not inflamed   Pharynx:  Normal; moist mucous membranes  Neck:  Supple.  No adenopathy Lungs:  Clear to auscultation.  Breath sounds are equal.  Moving air well. Heart:  Regular rate and rhythm without murmurs, rubs, or gallops.  Abdomen:  Nontender without masses or hepatosplenomegaly.  Bowel sounds are present.  No CVA or flank tenderness.  Extremities:  No edema.  Skin:  No rash present.     UC Treatments / Results  Labs (all labs ordered are listed, but only abnormal results are  displayed) Labs Reviewed  POCT URINALYSIS DIP (MANUAL ENTRY) - Abnormal; Notable for the following:       Result Value   Clarity, UA cloudy (*)    Blood, UA moderate (*)    Protein Ur, POC trace (*)    Leukocytes, UA large (3+) (*)    All other components within normal limits  URINE CULTURE    EKG  EKG Interpretation None       Radiology No results found.  Procedures Procedures (including critical  care time)  Medications Ordered in UC Medications - No data to display   Initial Impression / Assessment and Plan / UC Course  I have reviewed the triage vital signs and the nursing notes.  Pertinent labs & imaging results that were available during my care of the patient were reviewed by me and considered in my medical decision making (see chart for details).  Clinical Course   Urine culture pending. Begin Macrobid 100mg  BID for one week. Increase fluid intake. May use non-prescription AZO for about two days, if desired, to decrease urinary discomfort.  If symptoms become significantly worse during the night or over the weekend, proceed to the local emergency room.  Followup with Family Doctor if not improved in one week.     Final Clinical Impressions(s) / UC Diagnoses   Final diagnoses:  Dysuria  Cystitis    New Prescriptions New Prescriptions   NITROFURANTOIN, MACROCRYSTAL-MONOHYDRATE, (MACROBID) 100 MG CAPSULE    Take 1 capsule (100 mg total) by mouth 2 (two) times daily. Take with food.     Kandra Nicolas, MD 12/31/16 2252

## 2016-12-15 NOTE — Discharge Instructions (Signed)
Increase fluid intake. May use non-prescription AZO for about two days, if desired, to decrease urinary discomfort.  If symptoms become significantly worse during the night or over the weekend, proceed to the local emergency room.  

## 2016-12-17 LAB — URINE CULTURE: Organism ID, Bacteria: NO GROWTH

## 2016-12-18 ENCOUNTER — Telehealth: Payer: Self-pay | Admitting: *Deleted

## 2016-12-18 NOTE — Telephone Encounter (Signed)
Callback: No answer, LMOM f/u from visit. UCX neg. Call back as needed.

## 2016-12-23 ENCOUNTER — Encounter (HOSPITAL_BASED_OUTPATIENT_CLINIC_OR_DEPARTMENT_OTHER): Payer: Self-pay | Admitting: Orthopedic Surgery

## 2016-12-29 DIAGNOSIS — E039 Hypothyroidism, unspecified: Secondary | ICD-10-CM | POA: Diagnosis not present

## 2016-12-29 DIAGNOSIS — I10 Essential (primary) hypertension: Secondary | ICD-10-CM | POA: Diagnosis not present

## 2016-12-29 DIAGNOSIS — G43909 Migraine, unspecified, not intractable, without status migrainosus: Secondary | ICD-10-CM | POA: Diagnosis not present

## 2016-12-29 DIAGNOSIS — K219 Gastro-esophageal reflux disease without esophagitis: Secondary | ICD-10-CM | POA: Diagnosis not present

## 2016-12-29 DIAGNOSIS — Z0000 Encounter for general adult medical examination without abnormal findings: Secondary | ICD-10-CM | POA: Diagnosis not present

## 2017-01-13 DIAGNOSIS — M75112 Incomplete rotator cuff tear or rupture of left shoulder, not specified as traumatic: Secondary | ICD-10-CM | POA: Diagnosis not present

## 2017-01-13 DIAGNOSIS — M25512 Pain in left shoulder: Secondary | ICD-10-CM | POA: Diagnosis not present

## 2017-01-13 DIAGNOSIS — M25612 Stiffness of left shoulder, not elsewhere classified: Secondary | ICD-10-CM | POA: Diagnosis not present

## 2017-01-14 DIAGNOSIS — M25612 Stiffness of left shoulder, not elsewhere classified: Secondary | ICD-10-CM | POA: Diagnosis not present

## 2017-01-14 DIAGNOSIS — M75112 Incomplete rotator cuff tear or rupture of left shoulder, not specified as traumatic: Secondary | ICD-10-CM | POA: Diagnosis not present

## 2017-01-14 DIAGNOSIS — Z9889 Other specified postprocedural states: Secondary | ICD-10-CM | POA: Diagnosis not present

## 2017-01-14 DIAGNOSIS — M25512 Pain in left shoulder: Secondary | ICD-10-CM | POA: Diagnosis not present

## 2017-01-20 DIAGNOSIS — M25512 Pain in left shoulder: Secondary | ICD-10-CM | POA: Diagnosis not present

## 2017-01-20 DIAGNOSIS — M75112 Incomplete rotator cuff tear or rupture of left shoulder, not specified as traumatic: Secondary | ICD-10-CM | POA: Diagnosis not present

## 2017-01-20 DIAGNOSIS — M25612 Stiffness of left shoulder, not elsewhere classified: Secondary | ICD-10-CM | POA: Diagnosis not present

## 2017-01-22 DIAGNOSIS — M25512 Pain in left shoulder: Secondary | ICD-10-CM | POA: Diagnosis not present

## 2017-01-22 DIAGNOSIS — M75112 Incomplete rotator cuff tear or rupture of left shoulder, not specified as traumatic: Secondary | ICD-10-CM | POA: Diagnosis not present

## 2017-01-22 DIAGNOSIS — M25612 Stiffness of left shoulder, not elsewhere classified: Secondary | ICD-10-CM | POA: Diagnosis not present

## 2017-01-27 ENCOUNTER — Other Ambulatory Visit (HOSPITAL_COMMUNITY)
Admission: RE | Admit: 2017-01-27 | Discharge: 2017-01-27 | Disposition: A | Discharge status: Home / Self Care | Payer: BLUE CROSS/BLUE SHIELD | Source: Ambulatory Visit | Attending: Obstetrics & Gynecology | Admitting: Obstetrics & Gynecology

## 2017-01-27 ENCOUNTER — Other Ambulatory Visit: Payer: Self-pay | Admitting: Obstetrics & Gynecology

## 2017-01-27 DIAGNOSIS — Z01419 Encounter for gynecological examination (general) (routine) without abnormal findings: Secondary | ICD-10-CM | POA: Diagnosis not present

## 2017-01-27 DIAGNOSIS — Z1151 Encounter for screening for human papillomavirus (HPV): Secondary | ICD-10-CM | POA: Insufficient documentation

## 2017-01-27 DIAGNOSIS — M25612 Stiffness of left shoulder, not elsewhere classified: Secondary | ICD-10-CM | POA: Diagnosis not present

## 2017-01-27 DIAGNOSIS — M75112 Incomplete rotator cuff tear or rupture of left shoulder, not specified as traumatic: Secondary | ICD-10-CM | POA: Diagnosis not present

## 2017-01-27 DIAGNOSIS — M25512 Pain in left shoulder: Secondary | ICD-10-CM | POA: Diagnosis not present

## 2017-01-29 LAB — CYTOLOGY - PAP
Diagnosis: NEGATIVE
HPV: NOT DETECTED

## 2017-02-03 DIAGNOSIS — M25512 Pain in left shoulder: Secondary | ICD-10-CM | POA: Diagnosis not present

## 2017-02-03 DIAGNOSIS — M75112 Incomplete rotator cuff tear or rupture of left shoulder, not specified as traumatic: Secondary | ICD-10-CM | POA: Diagnosis not present

## 2017-02-03 DIAGNOSIS — M25612 Stiffness of left shoulder, not elsewhere classified: Secondary | ICD-10-CM | POA: Diagnosis not present

## 2017-02-05 DIAGNOSIS — M75112 Incomplete rotator cuff tear or rupture of left shoulder, not specified as traumatic: Secondary | ICD-10-CM | POA: Diagnosis not present

## 2017-02-05 DIAGNOSIS — M25512 Pain in left shoulder: Secondary | ICD-10-CM | POA: Diagnosis not present

## 2017-02-05 DIAGNOSIS — M25612 Stiffness of left shoulder, not elsewhere classified: Secondary | ICD-10-CM | POA: Diagnosis not present

## 2017-02-10 DIAGNOSIS — M722 Plantar fascial fibromatosis: Secondary | ICD-10-CM | POA: Diagnosis not present

## 2017-02-11 DIAGNOSIS — M75112 Incomplete rotator cuff tear or rupture of left shoulder, not specified as traumatic: Secondary | ICD-10-CM | POA: Diagnosis not present

## 2017-02-11 DIAGNOSIS — M25512 Pain in left shoulder: Secondary | ICD-10-CM | POA: Diagnosis not present

## 2017-02-11 DIAGNOSIS — M25612 Stiffness of left shoulder, not elsewhere classified: Secondary | ICD-10-CM | POA: Diagnosis not present

## 2017-02-12 DIAGNOSIS — M25612 Stiffness of left shoulder, not elsewhere classified: Secondary | ICD-10-CM | POA: Diagnosis not present

## 2017-02-12 DIAGNOSIS — M25512 Pain in left shoulder: Secondary | ICD-10-CM | POA: Diagnosis not present

## 2017-02-12 DIAGNOSIS — M75112 Incomplete rotator cuff tear or rupture of left shoulder, not specified as traumatic: Secondary | ICD-10-CM | POA: Diagnosis not present

## 2017-02-17 DIAGNOSIS — M75112 Incomplete rotator cuff tear or rupture of left shoulder, not specified as traumatic: Secondary | ICD-10-CM | POA: Diagnosis not present

## 2017-02-17 DIAGNOSIS — M25612 Stiffness of left shoulder, not elsewhere classified: Secondary | ICD-10-CM | POA: Diagnosis not present

## 2017-02-17 DIAGNOSIS — M25512 Pain in left shoulder: Secondary | ICD-10-CM | POA: Diagnosis not present

## 2017-02-19 DIAGNOSIS — M75112 Incomplete rotator cuff tear or rupture of left shoulder, not specified as traumatic: Secondary | ICD-10-CM | POA: Diagnosis not present

## 2017-02-19 DIAGNOSIS — M25512 Pain in left shoulder: Secondary | ICD-10-CM | POA: Diagnosis not present

## 2017-02-19 DIAGNOSIS — M25612 Stiffness of left shoulder, not elsewhere classified: Secondary | ICD-10-CM | POA: Diagnosis not present

## 2017-02-23 DIAGNOSIS — M25612 Stiffness of left shoulder, not elsewhere classified: Secondary | ICD-10-CM | POA: Diagnosis not present

## 2017-02-23 DIAGNOSIS — M75112 Incomplete rotator cuff tear or rupture of left shoulder, not specified as traumatic: Secondary | ICD-10-CM | POA: Diagnosis not present

## 2017-02-23 DIAGNOSIS — M25512 Pain in left shoulder: Secondary | ICD-10-CM | POA: Diagnosis not present

## 2017-02-25 DIAGNOSIS — M75112 Incomplete rotator cuff tear or rupture of left shoulder, not specified as traumatic: Secondary | ICD-10-CM | POA: Diagnosis not present

## 2017-02-25 DIAGNOSIS — Z9889 Other specified postprocedural states: Secondary | ICD-10-CM | POA: Diagnosis not present

## 2017-02-25 DIAGNOSIS — M25512 Pain in left shoulder: Secondary | ICD-10-CM | POA: Diagnosis not present

## 2017-02-25 DIAGNOSIS — M25612 Stiffness of left shoulder, not elsewhere classified: Secondary | ICD-10-CM | POA: Diagnosis not present

## 2017-02-26 DIAGNOSIS — R922 Inconclusive mammogram: Secondary | ICD-10-CM | POA: Diagnosis not present

## 2017-03-02 DIAGNOSIS — M25512 Pain in left shoulder: Secondary | ICD-10-CM | POA: Diagnosis not present

## 2017-03-02 DIAGNOSIS — M75112 Incomplete rotator cuff tear or rupture of left shoulder, not specified as traumatic: Secondary | ICD-10-CM | POA: Diagnosis not present

## 2017-03-02 DIAGNOSIS — M25612 Stiffness of left shoulder, not elsewhere classified: Secondary | ICD-10-CM | POA: Diagnosis not present

## 2017-03-04 DIAGNOSIS — M75112 Incomplete rotator cuff tear or rupture of left shoulder, not specified as traumatic: Secondary | ICD-10-CM | POA: Diagnosis not present

## 2017-03-04 DIAGNOSIS — M25512 Pain in left shoulder: Secondary | ICD-10-CM | POA: Diagnosis not present

## 2017-03-04 DIAGNOSIS — M25612 Stiffness of left shoulder, not elsewhere classified: Secondary | ICD-10-CM | POA: Diagnosis not present

## 2017-03-09 DIAGNOSIS — M25612 Stiffness of left shoulder, not elsewhere classified: Secondary | ICD-10-CM | POA: Diagnosis not present

## 2017-03-09 DIAGNOSIS — M75112 Incomplete rotator cuff tear or rupture of left shoulder, not specified as traumatic: Secondary | ICD-10-CM | POA: Diagnosis not present

## 2017-03-09 DIAGNOSIS — M25512 Pain in left shoulder: Secondary | ICD-10-CM | POA: Diagnosis not present

## 2017-03-12 DIAGNOSIS — M25612 Stiffness of left shoulder, not elsewhere classified: Secondary | ICD-10-CM | POA: Diagnosis not present

## 2017-03-12 DIAGNOSIS — M25512 Pain in left shoulder: Secondary | ICD-10-CM | POA: Diagnosis not present

## 2017-03-12 DIAGNOSIS — M75112 Incomplete rotator cuff tear or rupture of left shoulder, not specified as traumatic: Secondary | ICD-10-CM | POA: Diagnosis not present

## 2017-03-23 DIAGNOSIS — M25512 Pain in left shoulder: Secondary | ICD-10-CM | POA: Diagnosis not present

## 2017-03-23 DIAGNOSIS — M75112 Incomplete rotator cuff tear or rupture of left shoulder, not specified as traumatic: Secondary | ICD-10-CM | POA: Diagnosis not present

## 2017-03-23 DIAGNOSIS — M25612 Stiffness of left shoulder, not elsewhere classified: Secondary | ICD-10-CM | POA: Diagnosis not present

## 2017-03-25 DIAGNOSIS — M25612 Stiffness of left shoulder, not elsewhere classified: Secondary | ICD-10-CM | POA: Diagnosis not present

## 2017-03-25 DIAGNOSIS — M75112 Incomplete rotator cuff tear or rupture of left shoulder, not specified as traumatic: Secondary | ICD-10-CM | POA: Diagnosis not present

## 2017-03-25 DIAGNOSIS — M25512 Pain in left shoulder: Secondary | ICD-10-CM | POA: Diagnosis not present

## 2017-03-30 DIAGNOSIS — M75112 Incomplete rotator cuff tear or rupture of left shoulder, not specified as traumatic: Secondary | ICD-10-CM | POA: Diagnosis not present

## 2017-03-30 DIAGNOSIS — M25612 Stiffness of left shoulder, not elsewhere classified: Secondary | ICD-10-CM | POA: Diagnosis not present

## 2017-03-30 DIAGNOSIS — M25512 Pain in left shoulder: Secondary | ICD-10-CM | POA: Diagnosis not present

## 2017-04-01 DIAGNOSIS — M25512 Pain in left shoulder: Secondary | ICD-10-CM | POA: Diagnosis not present

## 2017-04-01 DIAGNOSIS — M25612 Stiffness of left shoulder, not elsewhere classified: Secondary | ICD-10-CM | POA: Diagnosis not present

## 2017-04-01 DIAGNOSIS — M75112 Incomplete rotator cuff tear or rupture of left shoulder, not specified as traumatic: Secondary | ICD-10-CM | POA: Diagnosis not present

## 2017-04-06 DIAGNOSIS — M75112 Incomplete rotator cuff tear or rupture of left shoulder, not specified as traumatic: Secondary | ICD-10-CM | POA: Diagnosis not present

## 2017-04-06 DIAGNOSIS — M25512 Pain in left shoulder: Secondary | ICD-10-CM | POA: Diagnosis not present

## 2017-04-06 DIAGNOSIS — M25612 Stiffness of left shoulder, not elsewhere classified: Secondary | ICD-10-CM | POA: Diagnosis not present

## 2017-04-08 DIAGNOSIS — M75112 Incomplete rotator cuff tear or rupture of left shoulder, not specified as traumatic: Secondary | ICD-10-CM | POA: Diagnosis not present

## 2017-04-08 DIAGNOSIS — M25612 Stiffness of left shoulder, not elsewhere classified: Secondary | ICD-10-CM | POA: Diagnosis not present

## 2017-04-08 DIAGNOSIS — M25512 Pain in left shoulder: Secondary | ICD-10-CM | POA: Diagnosis not present

## 2017-05-29 DIAGNOSIS — M67911 Unspecified disorder of synovium and tendon, right shoulder: Secondary | ICD-10-CM | POA: Diagnosis not present

## 2017-06-02 ENCOUNTER — Other Ambulatory Visit: Payer: Self-pay | Admitting: Orthopedic Surgery

## 2017-06-02 DIAGNOSIS — M75121 Complete rotator cuff tear or rupture of right shoulder, not specified as traumatic: Secondary | ICD-10-CM

## 2017-06-10 DIAGNOSIS — D1801 Hemangioma of skin and subcutaneous tissue: Secondary | ICD-10-CM | POA: Diagnosis not present

## 2017-06-10 DIAGNOSIS — Z85828 Personal history of other malignant neoplasm of skin: Secondary | ICD-10-CM | POA: Diagnosis not present

## 2017-06-10 DIAGNOSIS — L814 Other melanin hyperpigmentation: Secondary | ICD-10-CM | POA: Diagnosis not present

## 2017-06-10 DIAGNOSIS — L821 Other seborrheic keratosis: Secondary | ICD-10-CM | POA: Diagnosis not present

## 2017-06-11 ENCOUNTER — Ambulatory Visit
Admission: RE | Admit: 2017-06-11 | Discharge: 2017-06-11 | Disposition: A | Discharge status: Home / Self Care | Payer: BLUE CROSS/BLUE SHIELD | Source: Ambulatory Visit | Attending: Orthopedic Surgery | Admitting: Orthopedic Surgery

## 2017-06-11 DIAGNOSIS — M75121 Complete rotator cuff tear or rupture of right shoulder, not specified as traumatic: Secondary | ICD-10-CM

## 2017-06-11 DIAGNOSIS — S40011A Contusion of right shoulder, initial encounter: Secondary | ICD-10-CM | POA: Diagnosis not present

## 2017-06-12 DIAGNOSIS — M67911 Unspecified disorder of synovium and tendon, right shoulder: Secondary | ICD-10-CM | POA: Diagnosis not present

## 2017-06-12 DIAGNOSIS — M24811 Other specific joint derangements of right shoulder, not elsewhere classified: Secondary | ICD-10-CM | POA: Diagnosis not present

## 2017-07-07 DIAGNOSIS — H6990 Unspecified Eustachian tube disorder, unspecified ear: Secondary | ICD-10-CM | POA: Diagnosis not present

## 2017-07-07 DIAGNOSIS — H9202 Otalgia, left ear: Secondary | ICD-10-CM | POA: Diagnosis not present

## 2017-07-14 DIAGNOSIS — M75111 Incomplete rotator cuff tear or rupture of right shoulder, not specified as traumatic: Secondary | ICD-10-CM | POA: Diagnosis not present

## 2017-07-14 DIAGNOSIS — M19011 Primary osteoarthritis, right shoulder: Secondary | ICD-10-CM | POA: Diagnosis not present

## 2017-07-14 DIAGNOSIS — G8918 Other acute postprocedural pain: Secondary | ICD-10-CM | POA: Diagnosis not present

## 2017-07-14 DIAGNOSIS — M7541 Impingement syndrome of right shoulder: Secondary | ICD-10-CM | POA: Diagnosis not present

## 2017-07-14 DIAGNOSIS — M7551 Bursitis of right shoulder: Secondary | ICD-10-CM | POA: Diagnosis not present

## 2017-07-20 DIAGNOSIS — M25611 Stiffness of right shoulder, not elsewhere classified: Secondary | ICD-10-CM | POA: Diagnosis not present

## 2017-07-20 DIAGNOSIS — M25511 Pain in right shoulder: Secondary | ICD-10-CM | POA: Diagnosis not present

## 2017-07-20 DIAGNOSIS — M19011 Primary osteoarthritis, right shoulder: Secondary | ICD-10-CM | POA: Diagnosis not present

## 2017-07-20 DIAGNOSIS — Z9889 Other specified postprocedural states: Secondary | ICD-10-CM | POA: Diagnosis not present

## 2017-07-29 DIAGNOSIS — M25511 Pain in right shoulder: Secondary | ICD-10-CM | POA: Diagnosis not present

## 2017-07-29 DIAGNOSIS — Z9889 Other specified postprocedural states: Secondary | ICD-10-CM | POA: Diagnosis not present

## 2017-07-29 DIAGNOSIS — M25611 Stiffness of right shoulder, not elsewhere classified: Secondary | ICD-10-CM | POA: Diagnosis not present

## 2017-07-30 IMAGING — MR MR SHOULDER*R* W/O CM
4 of 5 series · 14 of 40 positions shown · non-contrast
Comparison: None.

CLINICAL DATA: Right shoulder pain with movement for 1 year.

EXAM:
MRI OF THE RIGHT SHOULDER WITHOUT CONTRAST
TECHNIQUE: Multiplanar, multisequence MR imaging of the shoulder was performed.
No intravenous contrast was administered.

[Series 6: T2 fat-sat · axial · right · 3.0mm · 0.44mm/px · z∈[-29,+31]mm · 3 of 25 slices shown (1 of 3)]
[im 4/25]
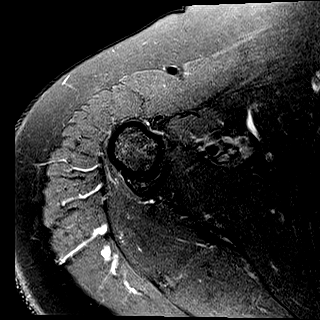
[im 14/25]
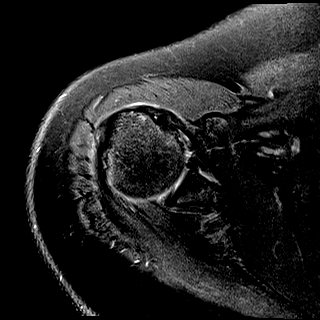
[im 21/25]
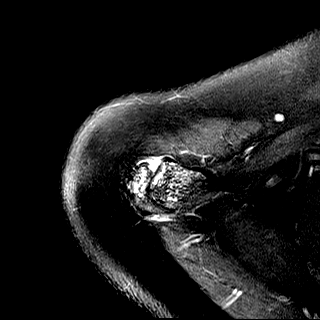

[Series 7: T2 fat-sat · oblique · right · 3.0mm · 0.44mm/px · 3 of 21 slices shown (2 of 3)]
[im 3/21]
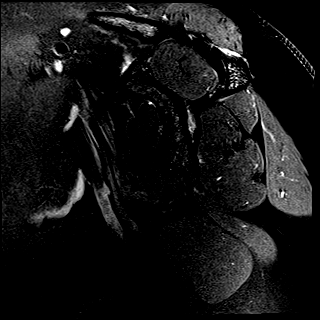
[im 12/21]
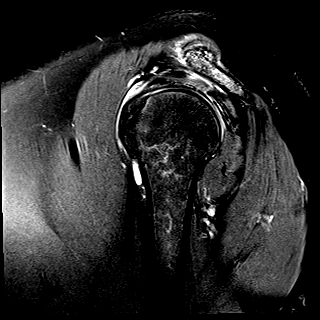
[im 18/21]
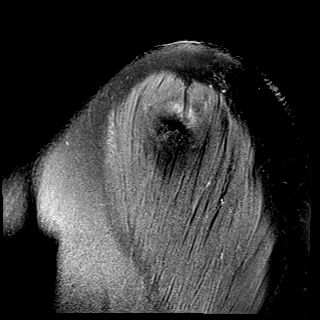

[Series 9: PD · oblique · right · 3.0mm · 0.18mm/px · 5 of 21 slices shown]
[im 1/21]
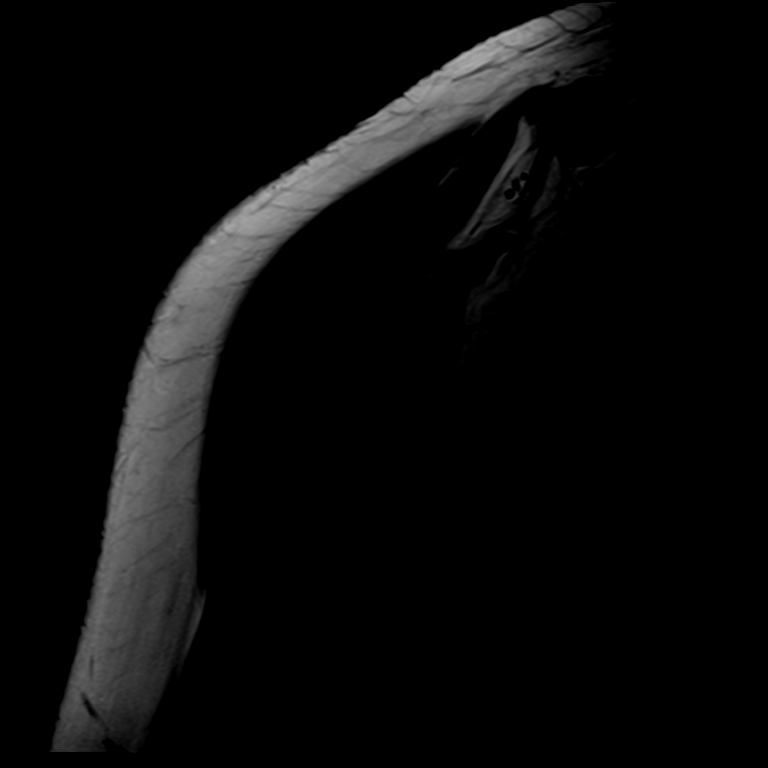
[im 3/21]
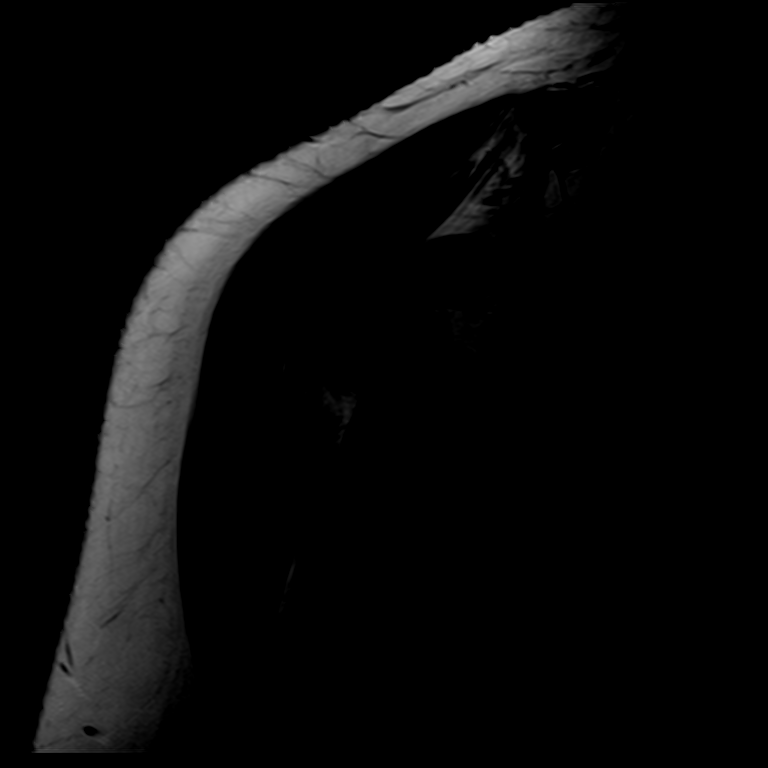
[im 6/21]
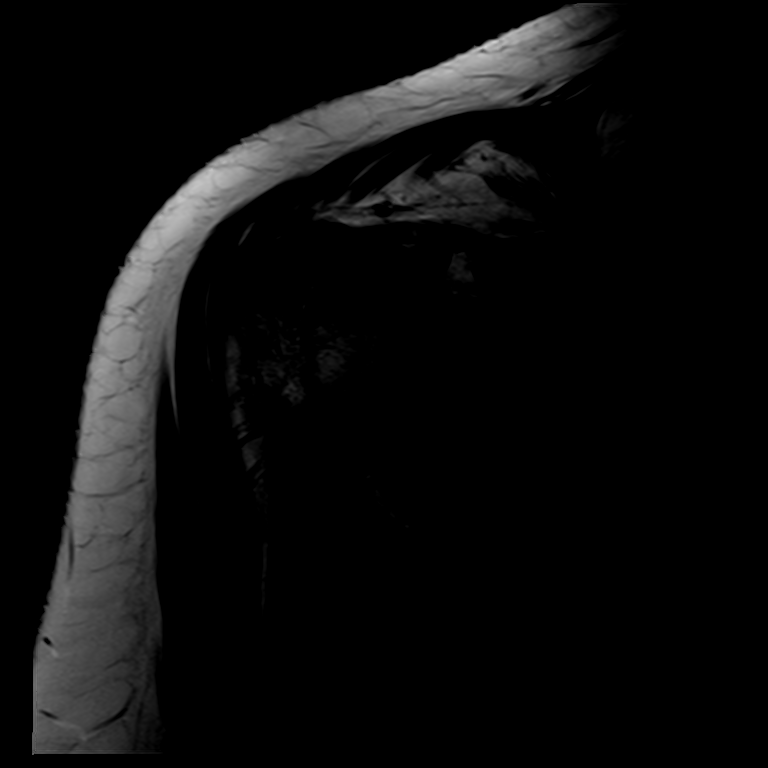
[im 12/21]
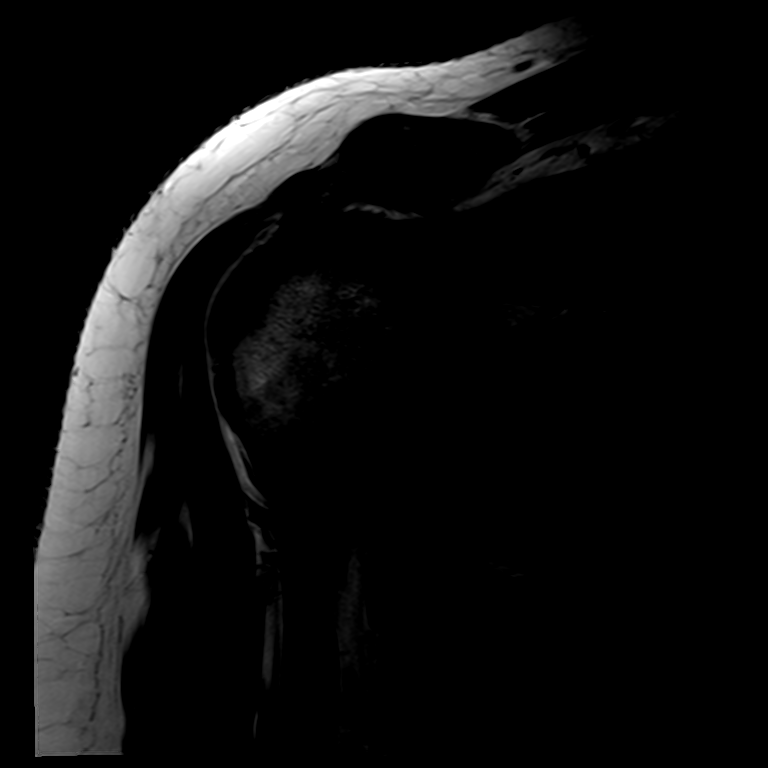
[im 18/21]
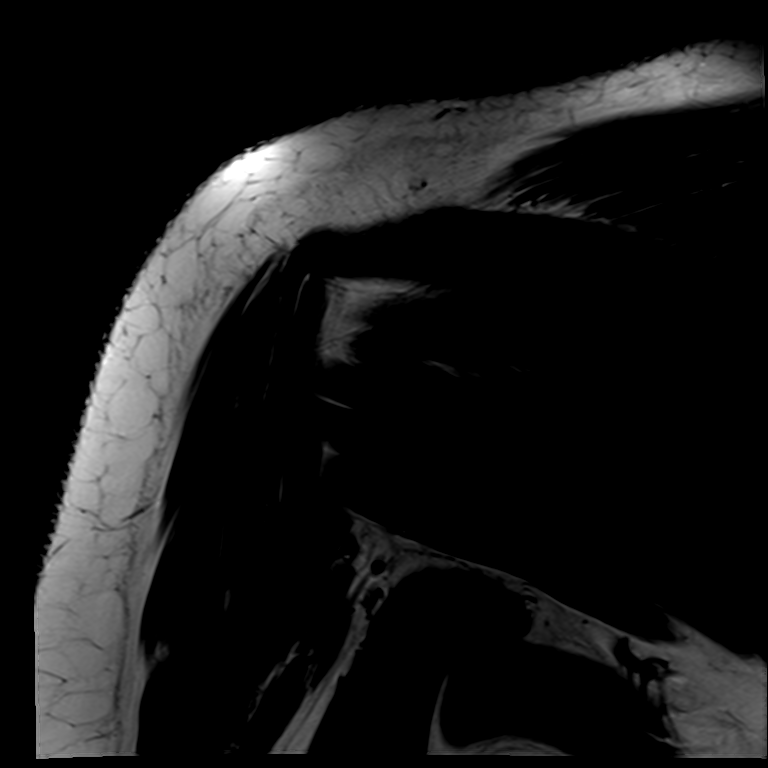

[Series 10: T2 fat-sat · oblique · right · 3.0mm · 0.22mm/px · 3 of 21 slices shown (3 of 3)]
[im 3/21]
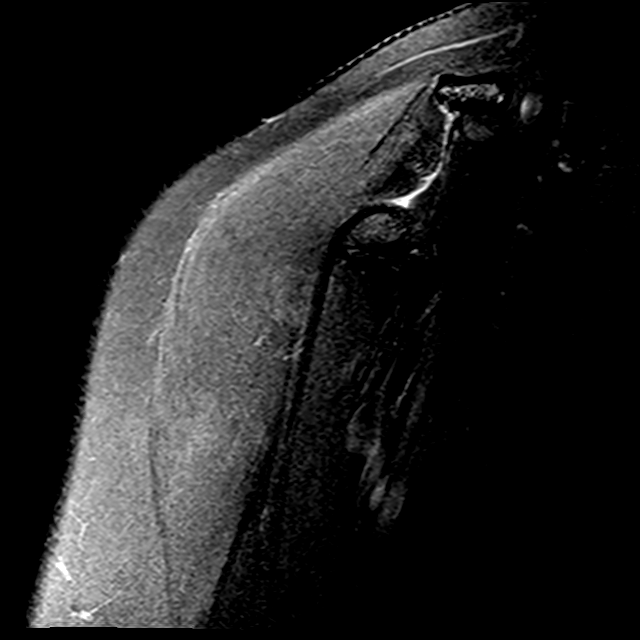
[im 12/21]
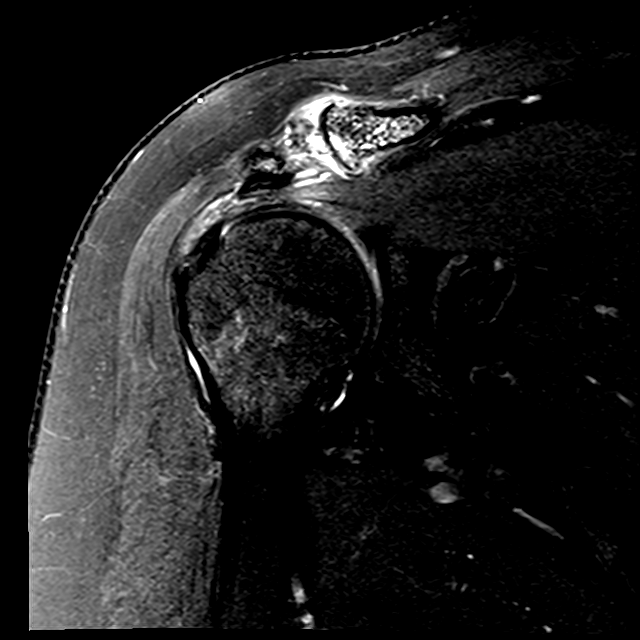
[im 18/21]
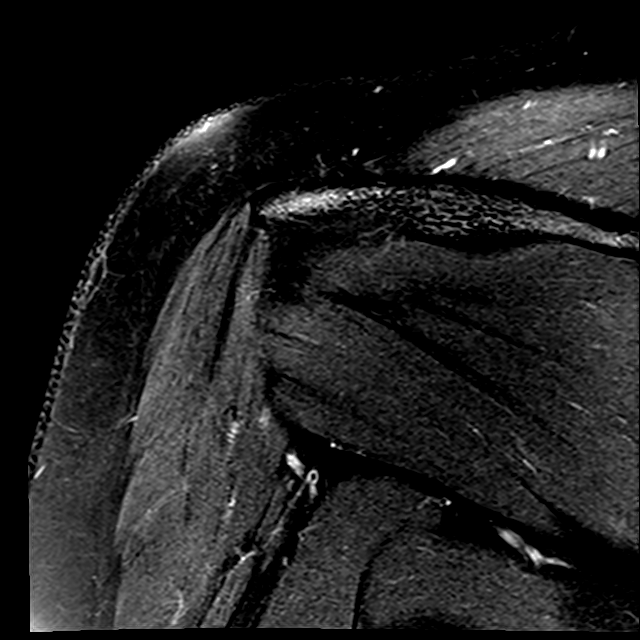

[14 of 40 positions shown; findings below may reference images not displayed]

FINDINGS: Rotator cuff: Moderate supraspinatus tendinopathy with mild
subscapularis and infraspinatus tendinopathy.

Muscles:  Unremarkable

Biceps long head:  Unremarkable

Acromioclavicular Joint: Moderate spurring with considerable marrow
edema adjacent to the AC joint. Mild surrounding soft tissue edema.
The acromial undersurface is type 1 (flat). Laterally downsloping
acromion may predispose to impingement. Mild subacromial subdeltoid
bursitis.

Glenohumeral Joint: Unremarkable

Labrum:  Grossly unremarkable

Bones:  Unremarkable

Other: No supplemental non-categorized findings.
IMPRESSION: 1. Overall moderate degenerative AC joint arthropathy but with a
considerable amount of adjacent marrow edema and mild adjacent soft
tissue edema.
2. Mild subacromial subdeltoid bursitis.
3. Moderate supraspinatus with mild infraspinatus and subscapularis
tendinopathy. No full-thickness rotator cuff tear is identified.
4. Laterally downsloping acromion may predispose to impingement.

## 2017-08-19 DIAGNOSIS — Z9889 Other specified postprocedural states: Secondary | ICD-10-CM | POA: Diagnosis not present

## 2017-09-30 DIAGNOSIS — S46811A Strain of other muscles, fascia and tendons at shoulder and upper arm level, right arm, initial encounter: Secondary | ICD-10-CM | POA: Diagnosis not present

## 2017-10-16 DIAGNOSIS — J0100 Acute maxillary sinusitis, unspecified: Secondary | ICD-10-CM | POA: Diagnosis not present

## 2017-11-04 DIAGNOSIS — M24811 Other specific joint derangements of right shoulder, not elsewhere classified: Secondary | ICD-10-CM | POA: Diagnosis not present

## 2017-12-16 DIAGNOSIS — M7501 Adhesive capsulitis of right shoulder: Secondary | ICD-10-CM | POA: Diagnosis not present

## 2017-12-24 DIAGNOSIS — M7501 Adhesive capsulitis of right shoulder: Secondary | ICD-10-CM | POA: Diagnosis not present

## 2017-12-24 DIAGNOSIS — M25511 Pain in right shoulder: Secondary | ICD-10-CM | POA: Diagnosis not present

## 2017-12-29 DIAGNOSIS — M25511 Pain in right shoulder: Secondary | ICD-10-CM | POA: Diagnosis not present

## 2017-12-29 DIAGNOSIS — M7501 Adhesive capsulitis of right shoulder: Secondary | ICD-10-CM | POA: Diagnosis not present

## 2018-01-01 DIAGNOSIS — M25511 Pain in right shoulder: Secondary | ICD-10-CM | POA: Diagnosis not present

## 2018-01-01 DIAGNOSIS — M7501 Adhesive capsulitis of right shoulder: Secondary | ICD-10-CM | POA: Diagnosis not present

## 2018-01-05 DIAGNOSIS — M7501 Adhesive capsulitis of right shoulder: Secondary | ICD-10-CM | POA: Diagnosis not present

## 2018-01-05 DIAGNOSIS — M25511 Pain in right shoulder: Secondary | ICD-10-CM | POA: Diagnosis not present

## 2018-01-08 DIAGNOSIS — M7501 Adhesive capsulitis of right shoulder: Secondary | ICD-10-CM | POA: Diagnosis not present

## 2018-01-08 DIAGNOSIS — M25511 Pain in right shoulder: Secondary | ICD-10-CM | POA: Diagnosis not present

## 2018-01-11 DIAGNOSIS — M7501 Adhesive capsulitis of right shoulder: Secondary | ICD-10-CM | POA: Diagnosis not present

## 2018-01-11 DIAGNOSIS — M25511 Pain in right shoulder: Secondary | ICD-10-CM | POA: Diagnosis not present

## 2018-01-14 DIAGNOSIS — M7501 Adhesive capsulitis of right shoulder: Secondary | ICD-10-CM | POA: Diagnosis not present

## 2018-01-14 DIAGNOSIS — M25511 Pain in right shoulder: Secondary | ICD-10-CM | POA: Diagnosis not present

## 2018-01-18 DIAGNOSIS — M25511 Pain in right shoulder: Secondary | ICD-10-CM | POA: Diagnosis not present

## 2018-01-18 DIAGNOSIS — M7501 Adhesive capsulitis of right shoulder: Secondary | ICD-10-CM | POA: Diagnosis not present

## 2018-01-25 DIAGNOSIS — E039 Hypothyroidism, unspecified: Secondary | ICD-10-CM | POA: Diagnosis not present

## 2018-01-25 DIAGNOSIS — Z0000 Encounter for general adult medical examination without abnormal findings: Secondary | ICD-10-CM | POA: Diagnosis not present

## 2018-01-25 DIAGNOSIS — M7501 Adhesive capsulitis of right shoulder: Secondary | ICD-10-CM | POA: Diagnosis not present

## 2018-01-25 DIAGNOSIS — J309 Allergic rhinitis, unspecified: Secondary | ICD-10-CM | POA: Diagnosis not present

## 2018-01-25 DIAGNOSIS — K219 Gastro-esophageal reflux disease without esophagitis: Secondary | ICD-10-CM | POA: Diagnosis not present

## 2018-01-25 DIAGNOSIS — M2550 Pain in unspecified joint: Secondary | ICD-10-CM | POA: Diagnosis not present

## 2018-01-25 DIAGNOSIS — G43909 Migraine, unspecified, not intractable, without status migrainosus: Secondary | ICD-10-CM | POA: Diagnosis not present

## 2018-01-29 DIAGNOSIS — Z01419 Encounter for gynecological examination (general) (routine) without abnormal findings: Secondary | ICD-10-CM | POA: Diagnosis not present

## 2018-02-01 DIAGNOSIS — M25511 Pain in right shoulder: Secondary | ICD-10-CM | POA: Diagnosis not present

## 2018-02-01 DIAGNOSIS — M7501 Adhesive capsulitis of right shoulder: Secondary | ICD-10-CM | POA: Diagnosis not present

## 2018-02-10 DIAGNOSIS — M7501 Adhesive capsulitis of right shoulder: Secondary | ICD-10-CM | POA: Diagnosis not present

## 2018-02-10 DIAGNOSIS — M25511 Pain in right shoulder: Secondary | ICD-10-CM | POA: Diagnosis not present

## 2018-02-15 DIAGNOSIS — M7501 Adhesive capsulitis of right shoulder: Secondary | ICD-10-CM | POA: Diagnosis not present

## 2018-02-15 DIAGNOSIS — M25511 Pain in right shoulder: Secondary | ICD-10-CM | POA: Diagnosis not present

## 2018-02-22 DIAGNOSIS — M25511 Pain in right shoulder: Secondary | ICD-10-CM | POA: Diagnosis not present

## 2018-02-22 DIAGNOSIS — M7501 Adhesive capsulitis of right shoulder: Secondary | ICD-10-CM | POA: Diagnosis not present

## 2018-03-02 DIAGNOSIS — M25511 Pain in right shoulder: Secondary | ICD-10-CM | POA: Diagnosis not present

## 2018-03-02 DIAGNOSIS — M7501 Adhesive capsulitis of right shoulder: Secondary | ICD-10-CM | POA: Diagnosis not present

## 2018-03-08 DIAGNOSIS — M7501 Adhesive capsulitis of right shoulder: Secondary | ICD-10-CM | POA: Diagnosis not present

## 2018-03-12 DIAGNOSIS — Z1231 Encounter for screening mammogram for malignant neoplasm of breast: Secondary | ICD-10-CM | POA: Diagnosis not present

## 2018-03-16 DIAGNOSIS — E039 Hypothyroidism, unspecified: Secondary | ICD-10-CM | POA: Diagnosis not present

## 2018-03-17 DIAGNOSIS — M7501 Adhesive capsulitis of right shoulder: Secondary | ICD-10-CM | POA: Diagnosis not present

## 2018-03-17 DIAGNOSIS — M25511 Pain in right shoulder: Secondary | ICD-10-CM | POA: Diagnosis not present

## 2018-03-24 DIAGNOSIS — M25511 Pain in right shoulder: Secondary | ICD-10-CM | POA: Diagnosis not present

## 2018-03-24 DIAGNOSIS — M7501 Adhesive capsulitis of right shoulder: Secondary | ICD-10-CM | POA: Diagnosis not present

## 2018-03-30 DIAGNOSIS — M25511 Pain in right shoulder: Secondary | ICD-10-CM | POA: Diagnosis not present

## 2018-03-30 DIAGNOSIS — M7501 Adhesive capsulitis of right shoulder: Secondary | ICD-10-CM | POA: Diagnosis not present

## 2018-04-12 DIAGNOSIS — M25511 Pain in right shoulder: Secondary | ICD-10-CM | POA: Diagnosis not present

## 2018-04-12 DIAGNOSIS — M7501 Adhesive capsulitis of right shoulder: Secondary | ICD-10-CM | POA: Diagnosis not present

## 2018-05-13 DIAGNOSIS — R946 Abnormal results of thyroid function studies: Secondary | ICD-10-CM | POA: Diagnosis not present

## 2018-07-14 DIAGNOSIS — E039 Hypothyroidism, unspecified: Secondary | ICD-10-CM | POA: Diagnosis not present

## 2018-08-02 DIAGNOSIS — R350 Frequency of micturition: Secondary | ICD-10-CM | POA: Diagnosis not present

## 2018-08-02 DIAGNOSIS — N950 Postmenopausal bleeding: Secondary | ICD-10-CM | POA: Diagnosis not present

## 2018-08-05 DIAGNOSIS — N951 Menopausal and female climacteric states: Secondary | ICD-10-CM | POA: Diagnosis not present

## 2018-08-05 DIAGNOSIS — N950 Postmenopausal bleeding: Secondary | ICD-10-CM | POA: Diagnosis not present

## 2018-08-05 DIAGNOSIS — R102 Pelvic and perineal pain: Secondary | ICD-10-CM | POA: Diagnosis not present

## 2018-08-09 DIAGNOSIS — E669 Obesity, unspecified: Secondary | ICD-10-CM | POA: Diagnosis not present

## 2018-08-09 DIAGNOSIS — N950 Postmenopausal bleeding: Secondary | ICD-10-CM | POA: Diagnosis not present

## 2018-08-09 DIAGNOSIS — E039 Hypothyroidism, unspecified: Secondary | ICD-10-CM | POA: Diagnosis not present

## 2018-08-09 DIAGNOSIS — R6889 Other general symptoms and signs: Secondary | ICD-10-CM | POA: Diagnosis not present

## 2018-08-09 DIAGNOSIS — R102 Pelvic and perineal pain: Secondary | ICD-10-CM | POA: Diagnosis not present

## 2018-08-21 ENCOUNTER — Encounter: Payer: Self-pay | Admitting: Emergency Medicine

## 2018-08-21 ENCOUNTER — Other Ambulatory Visit: Payer: Self-pay

## 2018-08-21 ENCOUNTER — Emergency Department (INDEPENDENT_AMBULATORY_CARE_PROVIDER_SITE_OTHER)
Admission: EM | Admit: 2018-08-21 | Discharge: 2018-08-21 | Disposition: A | Discharge status: Home / Self Care | Payer: BLUE CROSS/BLUE SHIELD | Source: Home / Self Care | Attending: Family Medicine | Admitting: Family Medicine

## 2018-08-21 DIAGNOSIS — R21 Rash and other nonspecific skin eruption: Secondary | ICD-10-CM | POA: Diagnosis not present

## 2018-08-21 MED ORDER — MUPIROCIN 2 % EX OINT: TOPICAL_OINTMENT | CUTANEOUS | 0 refills | Status: DC

## 2018-08-21 MED ORDER — TRIAMCINOLONE ACETONIDE 0.1 % EX CREA: 1.0000 "application " | TOPICAL_CREAM | Freq: Two times a day (BID) | CUTANEOUS | 0 refills | Status: DC

## 2018-08-21 NOTE — Discharge Instructions (Signed)
°  Please keep rash/bites clean with warm water and mild soap. Please use the prescribed medications to help with itching and pain. It is recommended you follow up in 1 week with your family medicine provider or return to urgent care if not improving, sooner if significantly worsening.

## 2018-08-21 NOTE — ED Triage Notes (Signed)
Pt c/o of rash and drainage to her right leg after walking dog x1 week ago. States she applied Benadryl with no relief.

## 2018-08-21 NOTE — ED Provider Notes (Signed)
[facility]    CSN: [id number] Arrival date & time: 08/21/18  0936     History   Chief Complaint Chief Complaint  Patient presents with  . Rash    right leg only, minimal drainage    HPI Sarah Hayes is a 52 y.o. female.   HPI  Sarah Hayes is a 52 y.o. female presenting to UC with c/o 3 itchy red sore bumps on her Right ankle that she noticed about 1 week ago after walking her dog in tall grass.  She has tried Benadryl and cortisone cream with minimal relief.  The bumps drain as small amount of clear liquid.  Denies other rashes. No fever, chills, n/v/d.    Past Medical History:  Diagnosis Date  . Cancer ([place])    basal cell on legs and face  . GERD (gastroesophageal reflux disease)   . Hypothyroidism   . Migraine    migraines  . Thyroid disease     Patient Active Problem List   Diagnosis Date Noted  . 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 Chronic migraine w/o aura w/o status migrainosus, not intractable 04/12/2016  . HTN (hypertension) 04/12/2016  . Hypothyroidism 04/12/2016    Past Surgical History:  Procedure Laterality Date  . BLADDER SUSPENSION  2011  . SHOULDER ARTHROSCOPY WITH ROTATOR CUFF REPAIR AND SUBACROMIAL DECOMPRESSION Left 12/01/2016   Procedure: SHOULDER ARTHROSCOPY WITH DEBRIDEMENT, ROTATOR CUFF REPAIR, SUBACROMIAL DECOMPRESSION;  Surgeon: Tania Ade, MD;  Location: [facility];  Service: Orthopedics;  Laterality: Left;  SHOULDER ARTHROSCOPY WITH DEBRIDEMENT ROTATOR CUFF TEAR, SUBACROMIAL DECOMPRESSION  . TONSILLECTOMY      OB History   None      Home Medications    Prior to Admission medications   Medication Sig Start Date End Date Taking? Authorizing Provider  docusate sodium (COLACE) 100 MG capsule Take 1 capsule (100 mg total) by mouth 3 (three) times daily as needed. 12/01/16   Grier Mitts, PA-C  esomeprazole (NEXIUM) 40 MG capsule Take 40 mg by mouth daily at 12 noon.    [provider]  levothyroxine (SYNTHROID, LEVOTHROID) 125 MCG tablet Take 125 mcg by mouth daily before breakfast.    [provider]  methocarbamol (ROBAXIN) 500 MG tablet Take 500 mg by mouth every 8 (eight) hours. 04/04/16   [provider]  Multiple Vitamin (MULTI-VITAMINS PO) Take 1 tablet by mouth daily.     [provider]  mupirocin ointment (BACTROBAN) 2 % Apply to wound 3 times daily for 5 days 08/21/18   Noe Gens, PA-C  nitrofurantoin, macrocrystal-monohydrate, (MACROBID) 100 MG capsule Take 1 capsule (100 mg total) by mouth 2 (two) times daily. Take with food. 12/15/16   Kandra Nicolas, MD  nortriptyline (PAMELOR) 25 MG capsule Take 1 capsule (25 mg total) by mouth at bedtime. 04/04/16   Melvenia Beam, MD  oxyCODONE-acetaminophen (ROXICET) 5-325 MG tablet Take 1-2 tablets by mouth every 4 (four) hours as needed for severe pain. 12/01/16   Grier Mitts, PA-C  SUMAtriptan (IMITREX) 100 MG tablet Take 100 mg by mouth every 2 (two) hours as needed for migraine.  04/10/16   [provider]  SUMAtriptan Succinate (ZEMBRACE SYMTOUCH) 3 MG/0.5ML SOAJ Inject 1 Dose into the skin every 1 hour x 4 doses. 04/04/16   Melvenia Beam, MD  topiramate (TOPAMAX) 100 MG tablet Take 100 mg by mouth 2 (two) times daily.    [provider]  triamcinolone cream (KENALOG) 0.1 %  Apply 1 application topically 2 (two) times daily. 08/21/18   Noe Gens, PA-C    Family History Family History  Problem Relation Age of Onset  . Hypertension Mother   . Hypertension Father   . Diabetes Father   . Migraines Neg Hx     Social History Social History   Tobacco Use  . Smoking status: Never Smoker  . Smokeless tobacco: Never Used  Substance Use Topics  . Alcohol use: No  . Drug use: No     Allergies   Patient has no known allergies.   Review of Systems Review of Systems  Constitutional: Negative for chills and fever.  Musculoskeletal: Negative  for arthralgias, joint swelling and myalgias.  Skin: Positive for color change and rash.     Physical Exam Triage Vital Signs ED Triage Vitals [08/21/18 0947]  Enc Vitals Group     BP 139/85     Pulse Rate 88     Resp      Temp 98.2 F (36.8 C)     Temp Source Oral     SpO2 99 %     Weight 199 lb (90.3 kg)     Height 5\' 5"  (1.651 m)     Head Circumference      Peak Flow      Pain Score      Pain Loc      Pain Edu?      Excl. in [place]?    No data found.  Updated Vital Signs BP 139/85 (BP Location: Left Arm)   Pulse 88   Temp 98.2 F (36.8 C) (Oral)   Ht 5\' 5"  (1.651 m)   Wt 199 lb (90.3 kg)   SpO2 99%   BMI 33.12 kg/m   Visual Acuity Right Eye Distance:   Left Eye Distance:   Bilateral Distance:    Right Eye Near:   Left Eye Near:    Bilateral Near:     Physical Exam  Constitutional: She is oriented to person, place, and time. She appears well-developed and well-nourished.  HENT:  Head: Normocephalic and atraumatic.  Eyes: EOM are normal.  Neck: Normal range of motion.  Cardiovascular: Normal rate.  Pulmonary/Chest: Effort normal.  Musculoskeletal: Normal range of motion. She exhibits no edema or tenderness.  Neurological: She is alert and oriented to person, place, and time.  Skin: Skin is warm and dry. Rash noted. Rash is pustular. There is erythema.  Right medial lower leg/ankle: 3 erythematous pustular lesions. Minimally tender. Scant clear discharge. No red streaking.   Psychiatric: She has a normal mood and affect. Her behavior is normal.  Nursing note and vitals reviewed.    UC Treatments / Results  Labs (all labs ordered are listed, but only abnormal results are displayed) Labs Reviewed - No data to display  EKG None  Radiology No results found.  Procedures Procedures (including critical care time)  Medications Ordered in UC Medications - No data to display  Initial Impression / Assessment and Plan / UC Course  I have reviewed the  triage vital signs and the nursing notes.  Pertinent labs & imaging results that were available during my care of the patient were reviewed by me and considered in my medical decision making (see chart for details).     Will treat symptomatically with triamcinolone cream and cover for secondary bacterial skin infection with mupirocin ointment Home care instructions provided.  Final Clinical Impressions(s) / UC Diagnoses   Final diagnoses:  Rash  and nonspecific skin eruption     Discharge Instructions      Please keep rash/bites clean with warm water and mild soap. Please use the prescribed medications to help with itching and pain. It is recommended you follow up in 1 week with your family medicine provider or return to urgent care if not improving, sooner if significantly worsening.     ED Prescriptions    Medication Sig Dispense Auth. Provider   triamcinolone cream (KENALOG) 0.1 % Apply 1 application topically 2 (two) times daily. 30 g Noe Gens, PA-C   mupirocin ointment (BACTROBAN) 2 % Apply to wound 3 times daily for 5 days 22 g Noe Gens, [place]     Controlled Substance Prescriptions [place] Controlled Substance Registry consulted? Not Applicable   Tyrell Antonio 08/21/18 1108

## 2018-09-13 DIAGNOSIS — R946 Abnormal results of thyroid function studies: Secondary | ICD-10-CM | POA: Diagnosis not present

## 2018-09-18 ENCOUNTER — Other Ambulatory Visit: Payer: Self-pay

## 2018-09-18 ENCOUNTER — Emergency Department (INDEPENDENT_AMBULATORY_CARE_PROVIDER_SITE_OTHER)
Admission: EM | Admit: 2018-09-18 | Discharge: 2018-09-18 | Disposition: A | Discharge status: Home / Self Care | Payer: BLUE CROSS/BLUE SHIELD | Source: Home / Self Care | Attending: Family Medicine | Admitting: Family Medicine

## 2018-09-18 DIAGNOSIS — J0100 Acute maxillary sinusitis, unspecified: Secondary | ICD-10-CM | POA: Diagnosis not present

## 2018-09-18 DIAGNOSIS — J029 Acute pharyngitis, unspecified: Secondary | ICD-10-CM

## 2018-09-18 MED ORDER — AMOXICILLIN-POT CLAVULANATE 875-125 MG PO TABS: 1.0000 | ORAL_TABLET | Freq: Two times a day (BID) | ORAL | 0 refills | Status: DC

## 2018-09-18 NOTE — Discharge Instructions (Signed)
°  Please take antibiotics as prescribed and be sure to complete entire course even if you start to feel better to ensure infection does not come back. ° °Please follow up with family medicine in 1 week if not improving.  °

## 2018-09-18 NOTE — ED Provider Notes (Signed)
[facility]    CSN: [id number] Arrival date & time: 09/18/18  6440     History   Chief Complaint Chief Complaint  Patient presents with  . URI  . Fever  . Sore Throat    HPI Cassity Christian is a 52 y.o. female.   HPI  Alexsys Eskin is a 52 y.o. female presenting to UC with c/o 1 week URI symptoms with sinus congestion and scratchy throat that has progressed into a moderately severe sore throat, sinus pain and pressure, and fever Tmax 101*F last night. She has taken Tylenol, last dose last night. Denies n/v/d. No chills. 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    Past Medical History:  Diagnosis Date  . Cancer ([place])    basal cell on legs and face  . GERD (gastroesophageal reflux disease)   . Hypothyroidism   . Migraine    migraines  . Thyroid disease     Patient Active Problem List   Diagnosis Date Noted  . 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 Chronic migraine w/o aura w/o status migrainosus, not intractable 04/12/2016  . HTN (hypertension) 04/12/2016  . Hypothyroidism 04/12/2016    Past Surgical History:  Procedure Laterality Date  . BLADDER SUSPENSION  2011  . SHOULDER ARTHROSCOPY WITH ROTATOR CUFF REPAIR AND SUBACROMIAL DECOMPRESSION Left 12/01/2016   Procedure: SHOULDER ARTHROSCOPY WITH DEBRIDEMENT, ROTATOR CUFF REPAIR, SUBACROMIAL DECOMPRESSION;  Surgeon: Tania Ade, MD;  Location: [facility];  Service: Orthopedics;  Laterality: Left;  SHOULDER ARTHROSCOPY WITH DEBRIDEMENT ROTATOR CUFF TEAR, SUBACROMIAL DECOMPRESSION  . TONSILLECTOMY      OB History   None      Home Medications    Prior to Admission medications   Medication Sig Start Date End Date Taking? Authorizing Provider  esomeprazole (NEXIUM) 40 MG capsule Take 40 mg by mouth daily at 12 noon.   Yes [provider]  levothyroxine (SYNTHROID, LEVOTHROID) 125 MCG tablet Take 125 mcg by  mouth daily before breakfast.   Yes [provider]  Multiple Vitamin (MULTI-VITAMINS PO) Take 1 tablet by mouth daily.    Yes [provider]  SUMAtriptan (IMITREX) 100 MG tablet Take 100 mg by mouth every 2 (two) hours as needed for migraine.  04/10/16  Yes [provider]  topiramate (TOPAMAX) 100 MG tablet Take 100 mg by mouth 2 (two) times daily.   Yes [provider]  amoxicillin-clavulanate (AUGMENTIN) 875-125 MG tablet Take 1 tablet by mouth 2 (two) times daily. One po bid x 7 days 09/18/18   Noe Gens, PA-C    Family History Family History  Problem Relation Age of Onset  . Hypertension Mother   . Hypertension Father   . Diabetes Father   . Migraines Neg Hx     Social History Social History   Tobacco Use  . Smoking status: Never Smoker  . Smokeless tobacco: Never Used  Substance Use Topics  . Alcohol use: No  . Drug use: No     Allergies   Patient has no known allergies.   Review of Systems Review of Systems  Constitutional: Positive for fever. Negative for chills.  HENT: Positive for congestion, postnasal drip, sinus pressure, sinus pain and sore throat. Negative for ear pain, trouble swallowing and voice change.   Respiratory: Positive for cough. Negative for shortness of breath.   Cardiovascular: Negative for chest pain and palpitations.  Gastrointestinal: Negative for abdominal pain, diarrhea,  nausea and vomiting.  Musculoskeletal: Negative for arthralgias, back pain and myalgias.  Skin: Negative for rash.     Physical Exam Triage Vital Signs ED Triage Vitals [09/18/18 1045]  Enc Vitals Group     BP 114/74     Pulse Rate 91     Resp      Temp 99.1 F (37.3 C)     Temp Source Oral     SpO2 99 %     Weight      Height      Head Circumference      Peak Flow      Pain Score      Pain Loc      Pain Edu?      Excl. in [place]?    No data found.  Updated Vital Signs BP 114/74 (BP Location: Right Arm)   Pulse 91    Temp 99.1 F (37.3 C) (Oral)   Wt 197 lb 6.4 oz (89.5 kg)   SpO2 99%   BMI 32.85 kg/m   Visual Acuity Right Eye Distance:   Left Eye Distance:   Bilateral Distance:    Right Eye Near:   Left Eye Near:    Bilateral Near:     Physical Exam  Constitutional: She is oriented to person, place, and time. She appears well-developed and well-nourished.  Non-toxic appearance. She does not appear ill. No distress.  HENT:  Head: Normocephalic and atraumatic.  Right Ear: Tympanic membrane normal.  Left Ear: Tympanic membrane normal.  Nose: Right sinus exhibits maxillary sinus tenderness. Right sinus exhibits no frontal sinus tenderness. Left sinus exhibits maxillary sinus tenderness. Left sinus exhibits no frontal sinus tenderness.  Mouth/Throat: Uvula is midline and mucous membranes are normal. Posterior oropharyngeal edema and posterior oropharyngeal erythema present. No oropharyngeal exudate or tonsillar abscesses.  Eyes: EOM are normal.  Neck: Normal range of motion. Neck supple.  Cardiovascular: Normal rate.  Pulmonary/Chest: Effort normal and breath sounds normal. No stridor. No respiratory distress. She has no wheezes. She has no rhonchi.  Musculoskeletal: Normal range of motion.  Lymphadenopathy:    She has cervical adenopathy.  Neurological: She is alert and oriented to person, place, and time.  Skin: Skin is warm and dry.  Psychiatric: She has a normal mood and affect. Her behavior is normal.  Nursing note and vitals reviewed.    UC Treatments / Results  Labs (all labs ordered are listed, but only abnormal results are displayed) Labs Reviewed - No data to display  EKG None  Radiology No results found.  Procedures Procedures (including critical care time)  Medications Ordered in UC Medications - No data to display  Initial Impression / Assessment and Plan / UC Course  I have reviewed the triage vital signs and the nursing notes.  Pertinent labs & imaging  results that were available during my care of the patient were reviewed by me and considered in my medical decision making (see chart for details).     Rapid strep: Invalid.  Hx and exam c/w acute maxillary sinusitis. Will start pt on Augmentin.  Strep culture not sent due to pt being tx with Augmentin for sinusitis, will cover for strep if present.  Final Clinical Impressions(s) / UC Diagnoses   Final diagnoses:  Acute non-recurrent maxillary sinusitis  Pharyngitis, unspecified etiology     Discharge Instructions      Please take antibiotics as prescribed and be sure to complete entire course even if you start to feel better to  ensure infection does not come back.  Please follow up with family medicine in 1 week if not improving.     ED Prescriptions    Medication Sig Dispense Auth. Provider   amoxicillin-clavulanate (AUGMENTIN) 875-125 MG tablet Take 1 tablet by mouth 2 (two) times daily. One po bid x 7 days 14 tablet Noe Gens, [place]     Controlled Substance Prescriptions [place] Controlled Substance Registry consulted? Not Applicable   Tyrell Antonio 09/18/18 1054

## 2018-09-18 NOTE — ED Triage Notes (Signed)
Here with 1 week of URI, sinus sx's and fever that started last night. Temp 101.1 took Tyenol. Denies n/v/chills.

## 2018-12-14 DIAGNOSIS — H9202 Otalgia, left ear: Secondary | ICD-10-CM | POA: Diagnosis not present

## 2018-12-14 DIAGNOSIS — H9312 Tinnitus, left ear: Secondary | ICD-10-CM | POA: Diagnosis not present

## 2019-01-06 DIAGNOSIS — H903 Sensorineural hearing loss, bilateral: Secondary | ICD-10-CM | POA: Diagnosis not present

## 2019-01-06 DIAGNOSIS — H9312 Tinnitus, left ear: Secondary | ICD-10-CM | POA: Diagnosis not present

## 2019-01-26 DIAGNOSIS — R7303 Prediabetes: Secondary | ICD-10-CM | POA: Diagnosis not present

## 2019-01-26 DIAGNOSIS — Z0000 Encounter for general adult medical examination without abnormal findings: Secondary | ICD-10-CM | POA: Diagnosis not present

## 2019-01-26 DIAGNOSIS — E039 Hypothyroidism, unspecified: Secondary | ICD-10-CM | POA: Diagnosis not present

## 2019-01-26 DIAGNOSIS — G43909 Migraine, unspecified, not intractable, without status migrainosus: Secondary | ICD-10-CM | POA: Diagnosis not present

## 2019-01-26 DIAGNOSIS — J309 Allergic rhinitis, unspecified: Secondary | ICD-10-CM | POA: Diagnosis not present

## 2019-01-29 ENCOUNTER — Emergency Department (INDEPENDENT_AMBULATORY_CARE_PROVIDER_SITE_OTHER)
Admission: EM | Admit: 2019-01-29 | Discharge: 2019-01-29 | Disposition: A | Discharge status: Home / Self Care | Payer: BLUE CROSS/BLUE SHIELD | Source: Home / Self Care | Attending: Family Medicine | Admitting: Family Medicine

## 2019-01-29 ENCOUNTER — Encounter: Payer: Self-pay | Admitting: Emergency Medicine

## 2019-01-29 ENCOUNTER — Other Ambulatory Visit: Payer: Self-pay

## 2019-01-29 DIAGNOSIS — R69 Illness, unspecified: Secondary | ICD-10-CM | POA: Diagnosis not present

## 2019-01-29 DIAGNOSIS — J111 Influenza due to unidentified influenza virus with other respiratory manifestations: Secondary | ICD-10-CM

## 2019-01-29 MED ORDER — OSELTAMIVIR PHOSPHATE 75 MG PO CAPS: 75.0000 mg | ORAL_CAPSULE | Freq: Two times a day (BID) | ORAL | 0 refills | Status: DC

## 2019-01-29 NOTE — ED Triage Notes (Signed)
Patient reports yesterday she had nausea and vomiting with fever of 101 dgrees; aches in general. Took Day-quil this morning.

## 2019-01-29 NOTE — Discharge Instructions (Addendum)
Take plain guaifenesin (1200mg  extended release tabs such as Mucinex) twice daily, with plenty of water, for cough and congestion.  May add Pseudoephedrine (30mg , one or two every 4 to 6 hours) for sinus congestion.  Get adequate rest.   May use Afrin nasal spray (or generic oxymetazoline) each morning for about 5 days and then discontinue.    Try warm salt water gargles for sore throat.  Stop all antihistamines for now, and other non-prescription cough/cold preparations. May take Ibuprofen 200mg , 4 tabs every 8 hours with food for body aches, headache, fever, etc. May take Delsym Cough Suppressant at bedtime for nighttime cough.

## 2019-01-29 NOTE — ED Provider Notes (Signed)
[facility]    CSN: [id number] Arrival date & time: 01/29/19  1225     History   Chief Complaint Chief Complaint  Patient presents with  . Generalized Body Aches  . Fever    HPI Sarah Hayes is a 53 y.o. female.   Yesterday patient developed flu-like symptoms  including myalgias, headache, fever 101/chills, fatigue, and cough.  She denies nasal congestion and sore throat.  Cough is non-productive and somewhat worse at night.  No pleuritic pain or shortness of breath.  She has not had a flu shot this season.     The history is provided by the patient.    Past Medical History:  Diagnosis Date  . Cancer ([place])    basal cell on legs and face  . GERD (gastroesophageal reflux disease)   . Hypothyroidism   . Migraine    migraines  . Thyroid disease     Patient Active Problem List   Diagnosis Date Noted  . 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 Chronic migraine w/o aura w/o status migrainosus, not intractable 04/12/2016  . HTN (hypertension) 04/12/2016  . Hypothyroidism 04/12/2016    Past Surgical History:  Procedure Laterality Date  . BLADDER SUSPENSION  2011  . SHOULDER ARTHROSCOPY WITH ROTATOR CUFF REPAIR AND SUBACROMIAL DECOMPRESSION Left 12/01/2016   Procedure: SHOULDER ARTHROSCOPY WITH DEBRIDEMENT, ROTATOR CUFF REPAIR, SUBACROMIAL DECOMPRESSION;  Surgeon: Tania Ade, MD;  Location: [facility];  Service: Orthopedics;  Laterality: Left;  SHOULDER ARTHROSCOPY WITH DEBRIDEMENT ROTATOR CUFF TEAR, SUBACROMIAL DECOMPRESSION  . TONSILLECTOMY      OB History   No obstetric history on file.      Home Medications    Prior to Admission medications   Medication Sig Start Date End Date Taking? Authorizing Provider  esomeprazole (NEXIUM) 40 MG capsule Take 40 mg by mouth daily at 12 noon.    [provider]  levothyroxine (SYNTHROID, LEVOTHROID) 125 MCG tablet Take 125 mcg by mouth daily before breakfast.    [provider]  Multiple Vitamin (MULTI-VITAMINS PO) Take 1 tablet by mouth daily.     [provider]  oseltamivir (TAMIFLU) 75 MG capsule Take 1 capsule (75 mg total) by mouth every 12 (twelve) hours. 01/29/19   Kandra Nicolas, MD  SUMAtriptan (IMITREX) 100 MG tablet Take 100 mg by mouth every 2 (two) hours as needed for migraine.  04/10/16   [provider]  topiramate (TOPAMAX) 100 MG tablet Take 100 mg by mouth 2 (two) times daily.    [provider]    Family History Family History  Problem Relation Age of Onset  . Hypertension Mother   . Hypertension Father   . Diabetes Father   . Migraines Neg Hx     Social History Social History   Tobacco Use  . Smoking status: Never Smoker  . Smokeless tobacco: Never Used  Substance Use Topics  . Alcohol use: No  . Drug use: No     Allergies   Patient has no known allergies.   Review of Systems Review of Systems No sore throat + cough No pleuritic pain No wheezing No nasal congestion + post-nasal drainage No sinus pain/pressure No itchy/red eyes No earache No hemoptysis No SOB + fever, + chills No nausea No vomiting No abdominal pain No diarrhea No urinary symptoms No skin rash + fatigue + myalgias + headache Used OTC meds without relief   Physical Exam Triage Vital Signs ED Triage Vitals  Enc  Vitals Group     BP 01/29/19 1251 121/79     Pulse Rate 01/29/19 1251 (!) 101     Resp 01/29/19 1251 18     Temp 01/29/19 1251 99.8 F (37.7 C)     Temp Source 01/29/19 1251 Oral     SpO2 01/29/19 1251 99 %     Weight 01/29/19 1252 201 lb (91.2 kg)     Height 01/29/19 1252 5\' 7"  (1.702 m)     Head Circumference --      Peak Flow --      Pain Score 01/29/19 1252 2     Pain Loc --      Pain Edu? --      Excl. in [place]? --    No data found.  Updated Vital Signs BP 121/79 (BP Location: Right Arm)   Pulse (!) 101   Temp 99.8 F (37.7 C) (Oral)   Resp 18   Ht 5\' 7"  (1.702 m)   Wt  91.2 kg   SpO2 99%   BMI 31.48 kg/m   Visual Acuity Right Eye Distance:   Left Eye Distance:   Bilateral Distance:    Right Eye Near:   Left Eye Near:    Bilateral Near:     Physical Exam Nursing notes and Vital Signs reviewed. Appearance:  Patient appears stated age, and in no acute distress Eyes:  Pupils are equal, round, and reactive to light and accomodation.  Extraocular movement is intact.  Conjunctivae are not inflamed  Ears:  Canals normal.  Tympanic membranes normal.  Nose:  Mildly congested turbinates.  No sinus tenderness.   Pharynx:  Normal Neck:  Supple.  Enlarged posterior/lateral nodes are palpated bilaterally, tender to palpation on the left.   Lungs:  Clear to auscultation.  Breath sounds are equal.  Moving air well. Heart:  Regular rate and rhythm without murmurs, rubs, or gallops.  Abdomen:  Nontender without masses or hepatosplenomegaly.  Bowel sounds are present.  No CVA or flank tenderness.  Extremities:  No edema.  Skin:  No rash present.    UC Treatments / Results  Labs (all labs ordered are listed, but only abnormal results are displayed) Labs Reviewed - No data to display  EKG None  Radiology No results found.  Procedures Procedures (including critical care time)  Medications Ordered in UC Medications - No data to display  Initial Impression / Assessment and Plan / UC Course  I have reviewed the triage vital signs and the nursing notes.  Pertinent labs & imaging results that were available during my care of the patient were reviewed by me and considered in my medical decision making (see chart for details).    Begin Tamiflu. Followup with Family Doctor if not improved in about 5 days.   Final Clinical Impressions(s) / UC Diagnoses   Final diagnoses:  Influenza-like illness     Discharge Instructions     Take plain guaifenesin (1200mg  extended release tabs such as Mucinex) twice daily, with plenty of water, for cough and  congestion.  May add Pseudoephedrine (30mg , one or two every 4 to 6 hours) for sinus congestion.  Get adequate rest.   May use Afrin nasal spray (or generic oxymetazoline) each morning for about 5 days and then discontinue.    Try warm salt water gargles for sore throat.  Stop all antihistamines for now, and other non-prescription cough/cold preparations. May take Ibuprofen 200mg , 4 tabs every 8 hours with food for body aches, headache,  fever, etc. May take Delsym Cough Suppressant at bedtime for nighttime cough.     ED Prescriptions    Medication Sig Dispense Auth. Provider   oseltamivir (TAMIFLU) 75 MG capsule Take 1 capsule (75 mg total) by mouth every 12 (twelve) hours. 10 capsule Kandra Nicolas, MD         Kandra Nicolas, MD 02/02/19 [phone]

## 2019-02-01 DIAGNOSIS — Z01411 Encounter for gynecological examination (general) (routine) with abnormal findings: Secondary | ICD-10-CM | POA: Diagnosis not present

## 2019-03-28 DIAGNOSIS — Z1283 Encounter for screening for malignant neoplasm of skin: Secondary | ICD-10-CM | POA: Diagnosis not present

## 2019-03-28 DIAGNOSIS — Z85828 Personal history of other malignant neoplasm of skin: Secondary | ICD-10-CM | POA: Diagnosis not present

## 2019-03-28 DIAGNOSIS — L905 Scar conditions and fibrosis of skin: Secondary | ICD-10-CM | POA: Diagnosis not present

## 2019-03-28 DIAGNOSIS — D2239 Melanocytic nevi of other parts of face: Secondary | ICD-10-CM | POA: Diagnosis not present

## 2019-06-14 DIAGNOSIS — K146 Glossodynia: Secondary | ICD-10-CM | POA: Diagnosis not present

## 2019-06-20 DIAGNOSIS — D2239 Melanocytic nevi of other parts of face: Secondary | ICD-10-CM | POA: Diagnosis not present

## 2019-06-23 DIAGNOSIS — D239 Other benign neoplasm of skin, unspecified: Secondary | ICD-10-CM | POA: Diagnosis not present

## 2019-07-28 DIAGNOSIS — M25562 Pain in left knee: Secondary | ICD-10-CM | POA: Diagnosis not present

## 2019-08-12 DIAGNOSIS — Z1231 Encounter for screening mammogram for malignant neoplasm of breast: Secondary | ICD-10-CM | POA: Diagnosis not present

## 2019-08-17 DIAGNOSIS — K219 Gastro-esophageal reflux disease without esophagitis: Secondary | ICD-10-CM | POA: Diagnosis not present

## 2019-10-31 ENCOUNTER — Encounter (HOSPITAL_BASED_OUTPATIENT_CLINIC_OR_DEPARTMENT_OTHER): Payer: Self-pay | Admitting: Emergency Medicine

## 2019-10-31 ENCOUNTER — Emergency Department (HOSPITAL_BASED_OUTPATIENT_CLINIC_OR_DEPARTMENT_OTHER)
Admission: EM | Admit: 2019-10-31 | Discharge: 2019-10-31 | Disposition: A | Discharge status: Home / Self Care | Payer: BC Managed Care – PPO | Attending: Emergency Medicine | Admitting: Emergency Medicine

## 2019-10-31 ENCOUNTER — Other Ambulatory Visit: Payer: Self-pay

## 2019-10-31 DIAGNOSIS — I10 Essential (primary) hypertension: Secondary | ICD-10-CM | POA: Diagnosis not present

## 2019-10-31 DIAGNOSIS — Z79899 Other long term (current) drug therapy: Secondary | ICD-10-CM | POA: Diagnosis not present

## 2019-10-31 DIAGNOSIS — K644 Residual hemorrhoidal skin tags: Secondary | ICD-10-CM | POA: Diagnosis not present

## 2019-10-31 DIAGNOSIS — K645 Perianal venous thrombosis: Secondary | ICD-10-CM | POA: Diagnosis not present

## 2019-10-31 DIAGNOSIS — K6289 Other specified diseases of anus and rectum: Secondary | ICD-10-CM | POA: Diagnosis not present

## 2019-10-31 HISTORY — DX: Unspecified hemorrhoids: K64.9

## 2019-10-31 MED ORDER — HYDROCODONE-ACETAMINOPHEN 5-325 MG PO TABS
2.0000 | ORAL_TABLET | Freq: Once | ORAL | Status: AC
  Administered 2019-10-31: 11:00:00 2 via ORAL
  Filled 2019-10-31: qty 2

## 2019-10-31 MED ORDER — DOCUSATE SODIUM 250 MG PO CAPS: 250.0000 mg | ORAL_CAPSULE | Freq: Every day | ORAL | 0 refills | Status: AC

## 2019-10-31 MED ORDER — LIDOCAINE HCL (PF) 1 % IJ SOLN
5.0000 mL | Freq: Once | INTRAMUSCULAR | Status: AC
  Administered 2019-10-31: 5 mL
  Filled 2019-10-31: qty 5

## 2019-10-31 MED ORDER — HYDROCORTISONE ACETATE 25 MG RE SUPP: 25.0000 mg | Freq: Two times a day (BID) | RECTAL | 0 refills | Status: AC

## 2019-10-31 MED ORDER — LIDOCAINE-EPINEPHRINE-TETRACAINE (LET) TOPICAL GEL
3.0000 mL | Freq: Once | TOPICAL | Status: AC
  Administered 2019-10-31: 3 mL via TOPICAL
  Filled 2019-10-31 (×2): qty 3

## 2019-10-31 NOTE — ED Notes (Signed)
ED Provider at bedside. 

## 2019-10-31 NOTE — ED Triage Notes (Signed)
Hemorrhoid pain x3 days.  Hx of same. Has done all of her normal procedures and OTC meds but it is only getting worse.  GI doc can't get her in til Friday and she sts she can't wait til Friday.  They recommended she come to ED.

## 2019-10-31 NOTE — Discharge Instructions (Addendum)
You have been diagnosed today with Thrombosed External Hemorrhoid.  At this time there does not appear to be the presence of an emergent medical condition, however there is always the potential for conditions to change. Please read and follow the below instructions.  Please return to the Emergency Department immediately for any new or worsening symptoms. Please be sure to follow up with your Primary Care Provider within one week regarding your visit today; please call their office to schedule an appointment even if you are feeling better for a follow-up visit. Please call your gastroenterologist office before they close today if they do not call you sooner to schedule a earlier follow-up appointment. Please continue your sitz bath's, Anusol, stool softener as directed to help continue improving your hemorrhoid.  Please use the Tucks pads and maxi pads to keep the area clean after using the bathroom. You have been given a pain medication called Norco today, this medication may make you drowsy.  Do not drive or perform any other potentially dangerous activities for the rest the day.  Do not drink alcohol or take any other sedating medications today as this may worsen side effects.  Get help right away if you have: Bleeding that will not stop. You have fever or chills You have chest pain or shortness of breath You have abdominal pain or nausea/vomiting You have pain when you pee or difficulty peeing You have severe pain You have any new/concerning or worsening of symptoms.  Please read the additional information packets attached to your discharge summary.  Do not take your medicine if  develop an itchy rash, swelling in your mouth or lips, or difficulty breathing; call 911 and seek immediate emergency medical attention if this occurs.  Note: Portions of this text may have been transcribed using voice recognition software. Every effort was made to ensure accuracy; however, inadvertent computerized  transcription errors may still be present.

## 2019-10-31 NOTE — ED Provider Notes (Signed)
[facility] EMERGENCY DEPARTMENT Provider Note   CSN: [id number] Arrival date & time: 10/31/19  [id number]     History   Chief Complaint Chief Complaint  Patient presents with  . Hemorrhoids    HPI Sarah Hayes is a 53 y.o. female.  Presents today with anal pain x2 days, she reports that she has an external hemorrhoid causing a severe throbbing pain constant worsened with sitting and laying flat on her back and without alleviating factors.  She has been using OTC topical medications without relief.  She called her gastroenterologist at [facility] but they are unable to get her an appointment until this Friday.  Patient reports that she has had multiple hemorrhoids in the past and approximately 10 years ago had an office-based procedure that resolved her pain at that time.  Denies fever/chills, headache, chest pain, abdominal pain, nausea/vomiting, diarrhea, dysuria/hematuria, blood in the stool, melena or any additional concerns.    HPI  Past Medical History:  Diagnosis Date  . Cancer ([place])    basal cell on legs and face  . GERD (gastroesophageal reflux disease)   . Hemorrhoids   . Hypothyroidism   . Migraine    migraines  . Thyroid disease     Patient Active Problem List   Diagnosis Date Noted  . 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 Chronic migraine w/o aura w/o status migrainosus, not intractable 04/12/2016  . HTN (hypertension) 04/12/2016  . Hypothyroidism 04/12/2016    Past Surgical History:  Procedure Laterality Date  . BLADDER SUSPENSION  2011  . SHOULDER ARTHROSCOPY WITH ROTATOR CUFF REPAIR AND SUBACROMIAL DECOMPRESSION Left 12/01/2016   Procedure: SHOULDER ARTHROSCOPY WITH DEBRIDEMENT, ROTATOR CUFF REPAIR, SUBACROMIAL DECOMPRESSION;  Surgeon: Tania Ade, MD;  Location: [facility];  Service: Orthopedics;  Laterality: Left;  SHOULDER ARTHROSCOPY WITH DEBRIDEMENT ROTATOR CUFF TEAR, SUBACROMIAL DECOMPRESSION  . TONSILLECTOMY        OB History   No obstetric history on file.      Home Medications    Prior to Admission medications   Medication Sig Start Date End Date Taking? Authorizing Provider  cetirizine (ZYRTEC) 10 MG tablet Take 10 mg by mouth daily.   Yes [provider]  docusate sodium (COLACE) 250 MG capsule Take 1 capsule (250 mg total) by mouth daily. 10/31/19   Nuala Alpha A, PA-C  esomeprazole (NEXIUM) 40 MG capsule Take 40 mg by mouth daily at 12 noon.    [provider]  hydrocortisone (ANUSOL-HC) 25 MG suppository Place 1 suppository (25 mg total) rectally 2 (two) times daily. 10/31/19   Nuala Alpha A, PA-C  levothyroxine (SYNTHROID, LEVOTHROID) 125 MCG tablet Take 88 mcg by mouth daily before breakfast.     [provider]  Multiple Vitamin (MULTI-VITAMINS PO) Take 1 tablet by mouth daily.     [provider]  SUMAtriptan (IMITREX) 100 MG tablet Take 100 mg by mouth every 2 (two) hours as needed for migraine.  04/10/16   [provider]  topiramate (TOPAMAX) 100 MG tablet Take 100 mg by mouth 2 (two) times daily.    [provider]    Family History Family History  Problem Relation Age of Onset  . Hypertension Mother   . Hypertension Father   . Diabetes Father   . Migraines Neg Hx     Social History Social History   Tobacco Use  . Smoking status: Never Smoker  . Smokeless tobacco: Never Used  Substance Use Topics  . Alcohol  use: No  . Drug use: No     Allergies   Patient has no known allergies.   Review of Systems Review of Systems Ten systems are reviewed and are negative for acute change except as noted in the HPI   Physical Exam Updated Vital Signs BP 128/81 (BP Location: Right Arm)   Pulse 94   Temp 98.3 F (36.8 C) (Oral)   Resp 16   Ht 5\' 6"  (1.676 m)   Wt 89.3 kg   LMP 09/03/2016 (Exact Date) Comment: Varies none since sept  SpO2 99%   BMI 31.78 kg/m   Physical Exam Constitutional:       General: She is not in acute distress.    Appearance: Normal appearance. She is well-developed. She is not ill-appearing or diaphoretic.  HENT:     Head: Normocephalic and atraumatic.     Right Ear: External ear normal.     Left Ear: External ear normal.     Nose: Nose normal.  Eyes:     General: Vision grossly intact. Gaze aligned appropriately.     Pupils: Pupils are equal, round, and reactive to light.  Neck:     Musculoskeletal: Normal range of motion.     Trachea: Trachea and phonation normal. No tracheal deviation.  Pulmonary:     Effort: Pulmonary effort is normal. No respiratory distress.  Abdominal:     General: There is no distension.     Palpations: Abdomen is soft.     Tenderness: There is no abdominal tenderness. There is no guarding or rebound.  Genitourinary:    Comments: Water quality scientist.  2cm thrombosed external hemorrhoid at 9o'clock position. Two additional small tags present. Musculoskeletal: Normal range of motion.  Skin:    General: Skin is warm and dry.  Neurological:     Mental Status: She is alert.     GCS: GCS eye subscore is 4. GCS verbal subscore is 5. GCS motor subscore is 6.     Comments: Speech is clear and goal oriented, follows commands Major Cranial nerves without deficit, no facial droop Moves extremities without ataxia, coordination intact  Psychiatric:        Behavior: Behavior normal.      ED Treatments / Results  Labs (all labs ordered are listed, but only abnormal results are displayed) Labs Reviewed - No data to display  EKG None  Radiology No results found.  Procedures .Marland KitchenIncision and Drainage  Date/Time: 10/31/2019 11:47 AM Performed by: Deliah Boston, PA-C Authorized by: Deliah Boston, PA-C   Consent:    Consent obtained:  Verbal   Consent given by:  Patient   Risks discussed:  Bleeding, incomplete drainage, pain, infection and damage to other organs Location:    Type:  External thrombosed hemorrhoid    Size:  2cm   Location:  Anogenital Pre-procedure details:    Skin preparation:  Betadine Anesthesia (see MAR for exact dosages):    Anesthesia method:  Topical application and local infiltration   Topical anesthetic:  LET   Local anesthetic:  Lidocaine 1% w/o epi Procedure type:    Complexity:  Simple Procedure details:    Incision types:  Single straight   Scalpel blade:  11   Drainage characteristics: Clot.   Drainage amount:  Moderate   Wound treatment:  Wound left open   Packing materials:  None Post-procedure details:    Patient tolerance of procedure:  Tolerated well, no immediate complications Comments:     Procedure chaperoned  by Rolene Arbour.   (including critical care time)  Medications Ordered in ED Medications  lidocaine (PF) (XYLOCAINE) 1 % injection 5 mL (5 mLs Infiltration Given 10/31/19 1033)  lidocaine-EPINEPHrine-tetracaine (LET) topical gel (3 mLs Topical Given 10/31/19 1042)  HYDROcodone-acetaminophen (NORCO/VICODIN) 5-325 MG per tablet 2 tablet (2 tablets Oral Given 10/31/19 1041)     Initial Impression / Assessment and Plan / ED Course  I have reviewed the triage vital signs and the nursing notes.  Pertinent labs & imaging results that were available during my care of the patient were reviewed by me and considered in my medical decision making (see chart for details).  Clinical Course as of Oct 31 1207  Mon Oct 30, 6749  220 53 year old female here with hemorrhoid pain for 3 days.  History of hemorrhoid but needed an excision 10 years ago.  She has a tender hemorrhoid along with a couple of other anal tags.  We discussed topical treatment versus potential benefit with incision.   [MB]  1111 Dr. Therisa Doyne; their office will reach out for sooner appointment.   [BM]  1150 Maxipads; Tucspads, anusol, stool softener.   [BM]    Clinical Course User Index [BM] Deliah Boston, PA-C [MB] Hayden Rasmussen, MD   53 year old female presents today with a 2-day  history of a thrombosed external hemorrhoid, she is with continued severe pain without relief with home therapies.  She has no systemic symptoms suggestive of infection, there is no evidence of rectal prolapse or internal hemorrhoid.  Patient was seen and evaluated by Dr. Melina Copa, recommends incision and drainage of thrombosed external hemorrhoid today and then discharge with Anusol, maxipads, Tucks pads and GI follow-up. - Consult was placed to [facility] gastroenterology where she is a patient, I spoke to Dr. Therisa Doyne, agrees with care plan and will have her staff reach out to patient to try and move her appointment to an earlier today. - Successful incision and drainage of a thrombosed external hemorrhoid as documented above tolerated well.  Patient has been given Norco for severe pain associated with her hemorrhoid, patient's husband is here to drive her home today.  Discussed narcotic precautions with patient and she states understanding not to drive or perform any dangerous activities for the rest the day. - Patient has been prescribed Anusol, instructed on cleaning with Tucks and maxipads, she is to call her GI office today if she does not hear from them sooner.  I have encouraged patient to continue sits baths at home.  There is no antibiotics indicated at this time, patient informed of signs and symptoms of infection and to return to the ER immediately if they occur.  Patient is to have recheck of the area in 2-3 days at her gastroenterologist.  At this time there does not appear to be any evidence of an acute emergency medical condition and the patient appears stable for discharge with appropriate outpatient follow up. Diagnosis was discussed with patient who verbalizes understanding of care plan and is agreeable to discharge. I have discussed return precautions with patient and husband who verbalizes understanding of return precautions. Patient encouraged to follow-up with their PCP GI. All questions  answered.  Patient's case rediscussed with Dr. Melina Copa who agrees with plan to discharge with outpatient follow-up.   Note: Portions of this report may have been transcribed using voice recognition software. Every effort was made to ensure accuracy; however, inadvertent computerized transcription errors may still be present. Final Clinical Impressions(s) / ED Diagnoses  Final diagnoses:  External hemorrhoid, thrombosed    ED Discharge Orders         Ordered    hydrocortisone (ANUSOL-HC) 25 MG suppository  2 times daily     10/31/19 1207    docusate sodium (COLACE) 250 MG capsule  Daily     10/31/19 1207           Gari Crown 10/31/19 1208    Hayden Rasmussen, MD 10/31/19 [phone]

## 2019-11-09 DIAGNOSIS — K644 Residual hemorrhoidal skin tags: Secondary | ICD-10-CM | POA: Diagnosis not present

## 2019-11-09 DIAGNOSIS — Z1211 Encounter for screening for malignant neoplasm of colon: Secondary | ICD-10-CM | POA: Diagnosis not present

## 2019-11-09 DIAGNOSIS — K5904 Chronic idiopathic constipation: Secondary | ICD-10-CM | POA: Diagnosis not present

## 2019-12-02 DIAGNOSIS — Z1211 Encounter for screening for malignant neoplasm of colon: Secondary | ICD-10-CM | POA: Diagnosis not present

## 2019-12-02 DIAGNOSIS — K589 Irritable bowel syndrome without diarrhea: Secondary | ICD-10-CM | POA: Diagnosis not present

## 2019-12-02 DIAGNOSIS — K644 Residual hemorrhoidal skin tags: Secondary | ICD-10-CM | POA: Diagnosis not present

## 2019-12-22 DIAGNOSIS — M7731 Calcaneal spur, right foot: Secondary | ICD-10-CM | POA: Diagnosis not present

## 2019-12-22 DIAGNOSIS — M7661 Achilles tendinitis, right leg: Secondary | ICD-10-CM | POA: Diagnosis not present

## 2019-12-28 DIAGNOSIS — Z01812 Encounter for preprocedural laboratory examination: Secondary | ICD-10-CM | POA: Diagnosis not present

## 2020-01-02 DIAGNOSIS — D124 Benign neoplasm of descending colon: Secondary | ICD-10-CM | POA: Diagnosis not present

## 2020-01-02 DIAGNOSIS — K644 Residual hemorrhoidal skin tags: Secondary | ICD-10-CM | POA: Diagnosis not present

## 2020-01-02 DIAGNOSIS — K5730 Diverticulosis of large intestine without perforation or abscess without bleeding: Secondary | ICD-10-CM | POA: Diagnosis not present

## 2020-01-02 DIAGNOSIS — Z1211 Encounter for screening for malignant neoplasm of colon: Secondary | ICD-10-CM | POA: Diagnosis not present

## 2020-01-02 DIAGNOSIS — K648 Other hemorrhoids: Secondary | ICD-10-CM | POA: Diagnosis not present

## 2020-02-07 DIAGNOSIS — R7303 Prediabetes: Secondary | ICD-10-CM | POA: Diagnosis not present

## 2020-02-07 DIAGNOSIS — J309 Allergic rhinitis, unspecified: Secondary | ICD-10-CM | POA: Diagnosis not present

## 2020-02-07 DIAGNOSIS — Z0000 Encounter for general adult medical examination without abnormal findings: Secondary | ICD-10-CM | POA: Diagnosis not present

## 2020-02-07 DIAGNOSIS — G43909 Migraine, unspecified, not intractable, without status migrainosus: Secondary | ICD-10-CM | POA: Diagnosis not present

## 2020-02-07 DIAGNOSIS — Z13220 Encounter for screening for lipoid disorders: Secondary | ICD-10-CM | POA: Diagnosis not present

## 2020-02-07 DIAGNOSIS — E039 Hypothyroidism, unspecified: Secondary | ICD-10-CM | POA: Diagnosis not present

## 2020-02-08 DIAGNOSIS — M7751 Other enthesopathy of right foot: Secondary | ICD-10-CM | POA: Diagnosis not present

## 2020-02-08 DIAGNOSIS — M7731 Calcaneal spur, right foot: Secondary | ICD-10-CM | POA: Diagnosis not present

## 2020-02-08 DIAGNOSIS — M7661 Achilles tendinitis, right leg: Secondary | ICD-10-CM | POA: Diagnosis not present

## 2020-02-24 DIAGNOSIS — Z01419 Encounter for gynecological examination (general) (routine) without abnormal findings: Secondary | ICD-10-CM | POA: Diagnosis not present

## 2020-02-29 DIAGNOSIS — M7661 Achilles tendinitis, right leg: Secondary | ICD-10-CM | POA: Diagnosis not present

## 2020-02-29 DIAGNOSIS — M7751 Other enthesopathy of right foot: Secondary | ICD-10-CM | POA: Diagnosis not present

## 2020-02-29 DIAGNOSIS — M7731 Calcaneal spur, right foot: Secondary | ICD-10-CM | POA: Diagnosis not present

## 2020-03-02 DIAGNOSIS — Z01812 Encounter for preprocedural laboratory examination: Secondary | ICD-10-CM | POA: Diagnosis not present

## 2020-03-02 DIAGNOSIS — Z20822 Contact with and (suspected) exposure to covid-19: Secondary | ICD-10-CM | POA: Diagnosis not present

## 2020-03-02 DIAGNOSIS — M7661 Achilles tendinitis, right leg: Secondary | ICD-10-CM | POA: Diagnosis not present

## 2020-03-09 DIAGNOSIS — M7731 Calcaneal spur, right foot: Secondary | ICD-10-CM | POA: Diagnosis not present

## 2020-03-09 DIAGNOSIS — M7751 Other enthesopathy of right foot: Secondary | ICD-10-CM | POA: Diagnosis not present

## 2020-03-09 DIAGNOSIS — G8918 Other acute postprocedural pain: Secondary | ICD-10-CM | POA: Diagnosis not present

## 2020-03-09 DIAGNOSIS — M7661 Achilles tendinitis, right leg: Secondary | ICD-10-CM | POA: Diagnosis not present

## 2020-03-13 DIAGNOSIS — Z4789 Encounter for other orthopedic aftercare: Secondary | ICD-10-CM | POA: Diagnosis not present

## 2020-03-27 DIAGNOSIS — M7751 Other enthesopathy of right foot: Secondary | ICD-10-CM | POA: Diagnosis not present

## 2020-03-27 DIAGNOSIS — M7731 Calcaneal spur, right foot: Secondary | ICD-10-CM | POA: Diagnosis not present

## 2020-03-27 DIAGNOSIS — M7661 Achilles tendinitis, right leg: Secondary | ICD-10-CM | POA: Diagnosis not present

## 2020-03-27 DIAGNOSIS — Z4789 Encounter for other orthopedic aftercare: Secondary | ICD-10-CM | POA: Diagnosis not present

## 2020-04-24 DIAGNOSIS — M25561 Pain in right knee: Secondary | ICD-10-CM | POA: Diagnosis not present

## 2020-04-24 DIAGNOSIS — M7651 Patellar tendinitis, right knee: Secondary | ICD-10-CM | POA: Diagnosis not present

## 2020-05-24 DIAGNOSIS — M25561 Pain in right knee: Secondary | ICD-10-CM | POA: Diagnosis not present

## 2020-06-20 DIAGNOSIS — M7671 Peroneal tendinitis, right leg: Secondary | ICD-10-CM | POA: Diagnosis not present

## 2020-06-20 DIAGNOSIS — Z9889 Other specified postprocedural states: Secondary | ICD-10-CM | POA: Diagnosis not present

## 2020-07-17 DIAGNOSIS — Z23 Encounter for immunization: Secondary | ICD-10-CM | POA: Diagnosis not present

## 2020-07-17 DIAGNOSIS — M25561 Pain in right knee: Secondary | ICD-10-CM | POA: Diagnosis not present

## 2020-08-01 DIAGNOSIS — D229 Melanocytic nevi, unspecified: Secondary | ICD-10-CM | POA: Diagnosis not present

## 2020-08-01 DIAGNOSIS — D1801 Hemangioma of skin and subcutaneous tissue: Secondary | ICD-10-CM | POA: Diagnosis not present

## 2020-08-01 DIAGNOSIS — Z85828 Personal history of other malignant neoplasm of skin: Secondary | ICD-10-CM | POA: Diagnosis not present

## 2020-08-01 DIAGNOSIS — L821 Other seborrheic keratosis: Secondary | ICD-10-CM | POA: Diagnosis not present

## 2020-08-09 ENCOUNTER — Emergency Department
Admission: RE | Admit: 2020-08-09 | Discharge: 2020-08-09 | Disposition: A | Discharge status: Home / Self Care | Payer: BC Managed Care – PPO | Source: Ambulatory Visit

## 2020-08-09 ENCOUNTER — Other Ambulatory Visit: Payer: Self-pay

## 2020-08-09 VITALS — BP 129/84 | HR 74 | Temp 98.8°F | Resp 18

## 2020-08-09 DIAGNOSIS — Z0189 Encounter for other specified special examinations: Secondary | ICD-10-CM | POA: Diagnosis not present

## 2020-08-09 NOTE — ED Triage Notes (Signed)
Pt here today for covid test required by work. Currently no sxs.

## 2020-08-11 LAB — SARS-COV-2 RNA,(COVID-19) QUALITATIVE NAAT: SARS CoV2 RNA: NOT DETECTED

## 2020-08-23 DIAGNOSIS — M25571 Pain in right ankle and joints of right foot: Secondary | ICD-10-CM | POA: Diagnosis not present

## 2020-08-23 DIAGNOSIS — M722 Plantar fascial fibromatosis: Secondary | ICD-10-CM | POA: Diagnosis not present

## 2020-09-26 DIAGNOSIS — H90A22 Sensorineural hearing loss, unilateral, left ear, with restricted hearing on the contralateral side: Secondary | ICD-10-CM | POA: Diagnosis not present

## 2020-09-28 DIAGNOSIS — M7661 Achilles tendinitis, right leg: Secondary | ICD-10-CM | POA: Diagnosis not present

## 2020-10-06 DIAGNOSIS — Z1231 Encounter for screening mammogram for malignant neoplasm of breast: Secondary | ICD-10-CM | POA: Diagnosis not present

## 2020-10-11 ENCOUNTER — Other Ambulatory Visit: Payer: Self-pay | Admitting: Orthopaedic Surgery

## 2020-10-11 DIAGNOSIS — M25571 Pain in right ankle and joints of right foot: Secondary | ICD-10-CM

## 2020-10-11 DIAGNOSIS — Z23 Encounter for immunization: Secondary | ICD-10-CM | POA: Diagnosis not present

## 2020-10-11 DIAGNOSIS — R634 Abnormal weight loss: Secondary | ICD-10-CM | POA: Diagnosis not present

## 2020-10-26 ENCOUNTER — Ambulatory Visit
Admission: RE | Admit: 2020-10-26 | Discharge: 2020-10-26 | Disposition: A | Discharge status: Home / Self Care | Payer: BC Managed Care – PPO | Source: Ambulatory Visit | Attending: Orthopaedic Surgery | Admitting: Orthopaedic Surgery

## 2020-10-26 DIAGNOSIS — M19071 Primary osteoarthritis, right ankle and foot: Secondary | ICD-10-CM | POA: Diagnosis not present

## 2020-10-26 DIAGNOSIS — M722 Plantar fascial fibromatosis: Secondary | ICD-10-CM | POA: Diagnosis not present

## 2020-10-26 DIAGNOSIS — M25571 Pain in right ankle and joints of right foot: Secondary | ICD-10-CM

## 2020-10-26 DIAGNOSIS — M65871 Other synovitis and tenosynovitis, right ankle and foot: Secondary | ICD-10-CM | POA: Diagnosis not present

## 2020-10-26 DIAGNOSIS — M76821 Posterior tibial tendinitis, right leg: Secondary | ICD-10-CM | POA: Diagnosis not present

## 2020-10-29 DIAGNOSIS — M7661 Achilles tendinitis, right leg: Secondary | ICD-10-CM | POA: Diagnosis not present

## 2020-12-28 DIAGNOSIS — M542 Cervicalgia: Secondary | ICD-10-CM | POA: Diagnosis not present

## 2021-02-09 DIAGNOSIS — Z20822 Contact with and (suspected) exposure to covid-19: Secondary | ICD-10-CM | POA: Diagnosis not present

## 2021-07-12 DIAGNOSIS — R6889 Other general symptoms and signs: Secondary | ICD-10-CM | POA: Diagnosis not present

## 2021-07-12 DIAGNOSIS — R031 Nonspecific low blood-pressure reading: Secondary | ICD-10-CM | POA: Diagnosis not present

## 2021-07-12 DIAGNOSIS — R197 Diarrhea, unspecified: Secondary | ICD-10-CM | POA: Diagnosis not present

## 2021-07-12 DIAGNOSIS — E039 Hypothyroidism, unspecified: Secondary | ICD-10-CM | POA: Diagnosis not present

## 2021-07-16 DIAGNOSIS — N63 Unspecified lump in unspecified breast: Secondary | ICD-10-CM | POA: Diagnosis not present

## 2021-07-22 DIAGNOSIS — D224 Melanocytic nevi of scalp and neck: Secondary | ICD-10-CM | POA: Diagnosis not present

## 2021-07-22 DIAGNOSIS — D229 Melanocytic nevi, unspecified: Secondary | ICD-10-CM | POA: Diagnosis not present

## 2021-07-22 DIAGNOSIS — L720 Epidermal cyst: Secondary | ICD-10-CM | POA: Diagnosis not present

## 2021-07-22 DIAGNOSIS — L814 Other melanin hyperpigmentation: Secondary | ICD-10-CM | POA: Diagnosis not present

## 2021-08-06 DIAGNOSIS — N6002 Solitary cyst of left breast: Secondary | ICD-10-CM | POA: Diagnosis not present

## 2021-08-06 DIAGNOSIS — R928 Other abnormal and inconclusive findings on diagnostic imaging of breast: Secondary | ICD-10-CM | POA: Diagnosis not present

## 2021-10-22 DIAGNOSIS — N63 Unspecified lump in unspecified breast: Secondary | ICD-10-CM | POA: Diagnosis not present

## 2022-03-20 DIAGNOSIS — H9312 Tinnitus, left ear: Secondary | ICD-10-CM | POA: Diagnosis not present

## 2022-03-20 DIAGNOSIS — H90A22 Sensorineural hearing loss, unilateral, left ear, with restricted hearing on the contralateral side: Secondary | ICD-10-CM | POA: Diagnosis not present

## 2022-03-20 DIAGNOSIS — R2681 Unsteadiness on feet: Secondary | ICD-10-CM | POA: Diagnosis not present

## 2022-04-17 DIAGNOSIS — Z124 Encounter for screening for malignant neoplasm of cervix: Secondary | ICD-10-CM | POA: Diagnosis not present

## 2022-04-17 DIAGNOSIS — Z01419 Encounter for gynecological examination (general) (routine) without abnormal findings: Secondary | ICD-10-CM | POA: Diagnosis not present

## 2022-04-26 DIAGNOSIS — M7061 Trochanteric bursitis, right hip: Secondary | ICD-10-CM | POA: Diagnosis not present

## 2022-04-26 DIAGNOSIS — M5451 Vertebrogenic low back pain: Secondary | ICD-10-CM | POA: Diagnosis not present

## 2022-05-10 DIAGNOSIS — M7711 Lateral epicondylitis, right elbow: Secondary | ICD-10-CM | POA: Diagnosis not present

## 2022-06-09 DIAGNOSIS — M7711 Lateral epicondylitis, right elbow: Secondary | ICD-10-CM | POA: Diagnosis not present

## 2022-06-18 DIAGNOSIS — R9082 White matter disease, unspecified: Secondary | ICD-10-CM | POA: Diagnosis not present

## 2022-06-18 DIAGNOSIS — H9192 Unspecified hearing loss, left ear: Secondary | ICD-10-CM | POA: Diagnosis not present

## 2022-06-18 DIAGNOSIS — H9190 Unspecified hearing loss, unspecified ear: Secondary | ICD-10-CM | POA: Diagnosis not present

## 2022-06-18 DIAGNOSIS — H9319 Tinnitus, unspecified ear: Secondary | ICD-10-CM | POA: Diagnosis not present

## 2022-06-18 DIAGNOSIS — R42 Dizziness and giddiness: Secondary | ICD-10-CM | POA: Diagnosis not present

## 2022-06-18 DIAGNOSIS — R2681 Unsteadiness on feet: Secondary | ICD-10-CM | POA: Diagnosis not present

## 2022-06-18 DIAGNOSIS — H9312 Tinnitus, left ear: Secondary | ICD-10-CM | POA: Diagnosis not present

## 2022-06-24 DIAGNOSIS — N61 Mastitis without abscess: Secondary | ICD-10-CM | POA: Diagnosis not present

## 2022-06-24 DIAGNOSIS — N63 Unspecified lump in unspecified breast: Secondary | ICD-10-CM | POA: Diagnosis not present

## 2022-06-27 DIAGNOSIS — Z0000 Encounter for general adult medical examination without abnormal findings: Secondary | ICD-10-CM | POA: Diagnosis not present

## 2022-06-27 DIAGNOSIS — R7303 Prediabetes: Secondary | ICD-10-CM | POA: Diagnosis not present

## 2022-06-27 DIAGNOSIS — Z13220 Encounter for screening for lipoid disorders: Secondary | ICD-10-CM | POA: Diagnosis not present

## 2022-06-27 DIAGNOSIS — J309 Allergic rhinitis, unspecified: Secondary | ICD-10-CM | POA: Diagnosis not present

## 2022-06-27 DIAGNOSIS — G43909 Migraine, unspecified, not intractable, without status migrainosus: Secondary | ICD-10-CM | POA: Diagnosis not present

## 2022-06-27 DIAGNOSIS — E039 Hypothyroidism, unspecified: Secondary | ICD-10-CM | POA: Diagnosis not present

## 2022-06-30 DIAGNOSIS — N611 Abscess of the breast and nipple: Secondary | ICD-10-CM | POA: Diagnosis not present

## 2022-06-30 DIAGNOSIS — L720 Epidermal cyst: Secondary | ICD-10-CM | POA: Diagnosis not present

## 2022-07-15 ENCOUNTER — Other Ambulatory Visit: Payer: Self-pay | Admitting: Obstetrics and Gynecology

## 2022-07-21 DIAGNOSIS — R928 Other abnormal and inconclusive findings on diagnostic imaging of breast: Secondary | ICD-10-CM | POA: Diagnosis not present

## 2022-07-21 DIAGNOSIS — N6323 Unspecified lump in the left breast, lower outer quadrant: Secondary | ICD-10-CM | POA: Diagnosis not present

## 2022-07-23 DIAGNOSIS — D2372 Other benign neoplasm of skin of left lower limb, including hip: Secondary | ICD-10-CM | POA: Diagnosis not present

## 2022-07-23 DIAGNOSIS — D229 Melanocytic nevi, unspecified: Secondary | ICD-10-CM | POA: Diagnosis not present

## 2022-07-23 DIAGNOSIS — L814 Other melanin hyperpigmentation: Secondary | ICD-10-CM | POA: Diagnosis not present

## 2022-07-23 DIAGNOSIS — D225 Melanocytic nevi of trunk: Secondary | ICD-10-CM | POA: Diagnosis not present

## 2022-08-06 DIAGNOSIS — N6002 Solitary cyst of left breast: Secondary | ICD-10-CM | POA: Diagnosis not present

## 2022-08-14 DIAGNOSIS — M7711 Lateral epicondylitis, right elbow: Secondary | ICD-10-CM | POA: Diagnosis not present

## 2022-09-02 DIAGNOSIS — E039 Hypothyroidism, unspecified: Secondary | ICD-10-CM | POA: Diagnosis not present

## 2022-10-13 DIAGNOSIS — M7711 Lateral epicondylitis, right elbow: Secondary | ICD-10-CM | POA: Diagnosis not present

## 2022-12-01 DIAGNOSIS — M7711 Lateral epicondylitis, right elbow: Secondary | ICD-10-CM | POA: Diagnosis not present

## 2022-12-07 ENCOUNTER — Other Ambulatory Visit: Payer: Self-pay

## 2022-12-07 ENCOUNTER — Encounter (HOSPITAL_BASED_OUTPATIENT_CLINIC_OR_DEPARTMENT_OTHER): Payer: Self-pay | Admitting: Emergency Medicine

## 2022-12-07 ENCOUNTER — Ambulatory Visit: Admit: 2022-12-07 | Payer: BC Managed Care – PPO

## 2022-12-07 ENCOUNTER — Emergency Department (HOSPITAL_BASED_OUTPATIENT_CLINIC_OR_DEPARTMENT_OTHER)
Admission: EM | Admit: 2022-12-07 | Discharge: 2022-12-07 | Disposition: A | Discharge status: Home / Self Care | Payer: BC Managed Care – PPO | Attending: Emergency Medicine | Admitting: Emergency Medicine

## 2022-12-07 DIAGNOSIS — Z4789 Encounter for other orthopedic aftercare: Secondary | ICD-10-CM | POA: Diagnosis not present

## 2022-12-07 DIAGNOSIS — Z4689 Encounter for fitting and adjustment of other specified devices: Secondary | ICD-10-CM | POA: Diagnosis not present

## 2022-12-07 NOTE — ED Triage Notes (Signed)
Pt here for cast check. Patient had surgery on Monday for tennis elbow and now cast is too tight.

## 2022-12-07 NOTE — ED Provider Notes (Signed)
[facility] Provider Note   CSN: [id number] Arrival date & time: 12/07/22  9892     History  Chief Complaint  Patient presents with   Cast Problem    Sarah Hayes is a 56 y.o. female.  HPI   56 year old female presents emergency department complaints of tight splint.  Patient had lateral epicondyle surgery this past Monday and was placed in splint with Ace bandage.  She states ever since awakening from surgery, splint has felt tight.  She reports emergency department due to increasing discomfort in right upper extremity and requesting for splint change.  Denies sensory deficits in affected hand.  Denies any repeat trauma to right upper extremity.  Past medical history significant for hypothyroidism, migraine, GERD, malignancy  Home Medications Prior to Admission medications   Medication Sig Start Date End Date Taking? Authorizing Provider  cetirizine (ZYRTEC) 10 MG tablet Take 10 mg by mouth daily.    [provider]  docusate sodium (COLACE) 250 MG capsule Take 1 capsule (250 mg total) by mouth daily. 10/31/19   Nuala Alpha A, PA-C  esomeprazole (NEXIUM) 40 MG capsule Take 40 mg by mouth daily at 12 noon.    [provider]  hydrocortisone (ANUSOL-HC) 25 MG suppository Place 1 suppository (25 mg total) rectally 2 (two) times daily. 10/31/19   Nuala Alpha A, PA-C  levothyroxine (SYNTHROID, LEVOTHROID) 125 MCG tablet Take 88 mcg by mouth daily before breakfast.     [provider]  Multiple Vitamin (MULTI-VITAMINS PO) Take 1 tablet by mouth daily.     [provider]  SUMAtriptan (IMITREX) 100 MG tablet Take 100 mg by mouth every 2 (two) hours as needed for migraine.  04/10/16   [provider]  topiramate (TOPAMAX) 100 MG tablet Take 100 mg by mouth 2 (two) times daily.    [provider]      Allergies    Patient has no known allergies.    Review of Systems   Review of Systems  All  other systems reviewed and are negative.   Physical Exam Updated Vital Signs BP 133/63 (BP Location: Left Arm)   Pulse 66   Temp 98.1 F (36.7 C) (Oral)   Resp 16   Ht '5\' 7"'$  (1.702 m)   Wt 81.6 kg   LMP 09/03/2016 (Exact Date) Comment: Varies none since sept  SpO2 100%   BMI 28.19 kg/m  Physical Exam Musculoskeletal:     Comments: Patient's surgical site overall well-appearing with no obvious extension of erythema or fluctuance appreciated.  Radial pulses full intact bilaterally.  Patient complaining no sensory deficits on major nerve distributions of upper extremities.  No range of motion of elbow conducted or hand given recent surgery.     ED Results / Procedures / Treatments   Labs (all labs ordered are listed, but only abnormal results are displayed) Labs Reviewed - No data to display  EKG None  Radiology No results found.  Procedures .Splint Application  Date/Time: 12/07/2022 12:04 PM  Performed by: Wilnette Kales, PA Authorized by: Wilnette Kales, PA   Consent:    Consent obtained:  Verbal   Consent given by:  Patient   Risks, benefits, and alternatives were discussed: yes     Risks discussed:  Discoloration, numbness, pain and swelling   Alternatives discussed:  No treatment, delayed treatment, alternative treatment, referral and observation Universal protocol:    Procedure explained and questions answered to patient or proxy's satisfaction: yes  Patient identity confirmed:  Verbally with patient Pre-procedure details:    Distal neurologic exam:  Normal   Distal perfusion: distal pulses strong   Procedure details:    Location:  Elbow   Elbow location:  R elbow   Strapping: no     Splint type:  Long arm   Supplies:  Cotton padding, fiberglass and sling   Attestation: Splint applied and adjusted personally by me   Post-procedure details:    Distal neurologic exam:  Normal   Procedure completion:  Tolerated well, no immediate complications    Post-procedure imaging: not applicable       Medications Ordered in ED Medications - No data to display  ED Course/ Medical Decision Making/ A&P                           Medical Decision Making  This patient presents to the ED for concern of cast issue, this involves an extensive number of treatment options, and is a complaint that carries with it a high risk of complications and morbidity.  The differential diagnosis includes neurovascular compromise, fracture, strain/sprain, dislocation, ischemic limb   Co morbidities that complicate the patient evaluation  See HPI   Additional history obtained:  Additional history obtained from EMR External records from outside source obtained and reviewed including hospital records   Lab Tests:  N/a   Imaging Studies ordered:  N/a   Cardiac Monitoring: / EKG:  The patient was maintained on a cardiac monitor.  I personally viewed and interpreted the cardiac monitored which showed an underlying rhythm of: Sinus rhythm   Consultations Obtained:  N/a   Problem List / ED Course / Critical interventions / Medication management  Splint issue Reevaluation of the patient showed that the patient improved I have reviewed the patients home medicines and have made adjustments as needed   Social Determinants of Health:  No tobacco or illicit drug use   Test / Admission - Considered:  Splint issue Vitals signs  within normal range and stable throughout visit. Patient noted significant relief of symptoms when Ace bandage was taken off.  Symptoms likely secondary to Ace bandage being applied too tightly.  Splint was removed and surgical site was overall well-appearing with no obvious cellulitic changes and skin.  Patient dressed with bulky dressing with splint reapplied and Ace bandage applied more loosely.  Patient recommended loosening/tightening Ace bandage at home as needed if symptoms reappear.  Recommend keeping splint on until  reevaluated by orthopedic outpatient.  Treatment plan discussed at length with patient and she acknowledged understanding was agreeable to said plan. Worrisome signs and symptoms were discussed with the patient, and the patient acknowledged understanding to return to the ED if noticed. Patient was stable upon discharge.          Final Clinical Impression(s) / ED Diagnoses Final diagnoses:  Problem with fiberglass cast    Rx / DC Orders ED Discharge Orders     None         Wilnette Kales, [place] 12/07/22 1207    Fredia Sorrow, MD 12/08/22 1714

## 2022-12-07 NOTE — Discharge Instructions (Signed)
As discussed, you may loosen Ace bandage if cast becomes discomfortable again but please keep splint on until reevaluated by orthopedic.  Continue taking medication as orthopedic prescribed.  Please do not hesitate to return to emergency department if the worrisome signs and symptoms we discussed become apparent.

## 2022-12-07 NOTE — ED Notes (Signed)
ED Provider at bedside to wrap  splint to right arm

## 2023-01-13 DIAGNOSIS — M542 Cervicalgia: Secondary | ICD-10-CM | POA: Diagnosis not present

## 2023-01-13 DIAGNOSIS — M7711 Lateral epicondylitis, right elbow: Secondary | ICD-10-CM | POA: Diagnosis not present

## 2023-01-20 DIAGNOSIS — M7711 Lateral epicondylitis, right elbow: Secondary | ICD-10-CM | POA: Diagnosis not present

## 2023-01-20 DIAGNOSIS — M6281 Muscle weakness (generalized): Secondary | ICD-10-CM | POA: Diagnosis not present

## 2023-01-23 DIAGNOSIS — M6281 Muscle weakness (generalized): Secondary | ICD-10-CM | POA: Diagnosis not present

## 2023-01-23 DIAGNOSIS — M7711 Lateral epicondylitis, right elbow: Secondary | ICD-10-CM | POA: Diagnosis not present

## 2023-01-26 DIAGNOSIS — M7711 Lateral epicondylitis, right elbow: Secondary | ICD-10-CM | POA: Diagnosis not present

## 2023-01-26 DIAGNOSIS — M6281 Muscle weakness (generalized): Secondary | ICD-10-CM | POA: Diagnosis not present

## 2023-01-28 DIAGNOSIS — M7711 Lateral epicondylitis, right elbow: Secondary | ICD-10-CM | POA: Diagnosis not present

## 2023-01-28 DIAGNOSIS — M6281 Muscle weakness (generalized): Secondary | ICD-10-CM | POA: Diagnosis not present

## 2023-02-06 DIAGNOSIS — M7711 Lateral epicondylitis, right elbow: Secondary | ICD-10-CM | POA: Diagnosis not present

## 2023-02-06 DIAGNOSIS — M6281 Muscle weakness (generalized): Secondary | ICD-10-CM | POA: Diagnosis not present

## 2023-02-09 DIAGNOSIS — M6281 Muscle weakness (generalized): Secondary | ICD-10-CM | POA: Diagnosis not present

## 2023-02-09 DIAGNOSIS — M7711 Lateral epicondylitis, right elbow: Secondary | ICD-10-CM | POA: Diagnosis not present

## 2023-02-11 DIAGNOSIS — M6281 Muscle weakness (generalized): Secondary | ICD-10-CM | POA: Diagnosis not present

## 2023-02-11 DIAGNOSIS — M7711 Lateral epicondylitis, right elbow: Secondary | ICD-10-CM | POA: Diagnosis not present

## 2023-02-16 DIAGNOSIS — M7711 Lateral epicondylitis, right elbow: Secondary | ICD-10-CM | POA: Diagnosis not present

## 2023-02-18 DIAGNOSIS — M6281 Muscle weakness (generalized): Secondary | ICD-10-CM | POA: Diagnosis not present

## 2023-02-18 DIAGNOSIS — M7711 Lateral epicondylitis, right elbow: Secondary | ICD-10-CM | POA: Diagnosis not present

## 2023-04-08 DIAGNOSIS — F4320 Adjustment disorder, unspecified: Secondary | ICD-10-CM | POA: Diagnosis not present

## 2023-04-08 DIAGNOSIS — H6692 Otitis media, unspecified, left ear: Secondary | ICD-10-CM | POA: Diagnosis not present

## 2023-04-08 DIAGNOSIS — J309 Allergic rhinitis, unspecified: Secondary | ICD-10-CM | POA: Diagnosis not present

## 2023-04-15 DIAGNOSIS — F411 Generalized anxiety disorder: Secondary | ICD-10-CM | POA: Diagnosis not present

## 2023-04-15 DIAGNOSIS — H6692 Otitis media, unspecified, left ear: Secondary | ICD-10-CM | POA: Diagnosis not present

## 2023-04-15 DIAGNOSIS — F4321 Adjustment disorder with depressed mood: Secondary | ICD-10-CM | POA: Diagnosis not present

## 2023-04-16 DIAGNOSIS — M79674 Pain in right toe(s): Secondary | ICD-10-CM | POA: Diagnosis not present

## 2023-04-16 DIAGNOSIS — L84 Corns and callosities: Secondary | ICD-10-CM | POA: Diagnosis not present

## 2023-05-15 DIAGNOSIS — F411 Generalized anxiety disorder: Secondary | ICD-10-CM | POA: Diagnosis not present

## 2023-05-15 DIAGNOSIS — F4321 Adjustment disorder with depressed mood: Secondary | ICD-10-CM | POA: Diagnosis not present

## 2023-05-18 DIAGNOSIS — F4323 Adjustment disorder with mixed anxiety and depressed mood: Secondary | ICD-10-CM | POA: Diagnosis not present

## 2023-05-25 DIAGNOSIS — F4323 Adjustment disorder with mixed anxiety and depressed mood: Secondary | ICD-10-CM | POA: Diagnosis not present

## 2023-05-25 DIAGNOSIS — F322 Major depressive disorder, single episode, severe without psychotic features: Secondary | ICD-10-CM | POA: Diagnosis not present

## 2023-06-02 DIAGNOSIS — F4323 Adjustment disorder with mixed anxiety and depressed mood: Secondary | ICD-10-CM | POA: Diagnosis not present

## 2023-06-02 DIAGNOSIS — F322 Major depressive disorder, single episode, severe without psychotic features: Secondary | ICD-10-CM | POA: Diagnosis not present

## 2023-06-09 DIAGNOSIS — F322 Major depressive disorder, single episode, severe without psychotic features: Secondary | ICD-10-CM | POA: Diagnosis not present

## 2023-06-22 DIAGNOSIS — F322 Major depressive disorder, single episode, severe without psychotic features: Secondary | ICD-10-CM | POA: Diagnosis not present

## 2023-07-06 DIAGNOSIS — F322 Major depressive disorder, single episode, severe without psychotic features: Secondary | ICD-10-CM | POA: Diagnosis not present

## 2023-07-08 DIAGNOSIS — Z0000 Encounter for general adult medical examination without abnormal findings: Secondary | ICD-10-CM | POA: Diagnosis not present

## 2023-07-08 DIAGNOSIS — G43909 Migraine, unspecified, not intractable, without status migrainosus: Secondary | ICD-10-CM | POA: Diagnosis not present

## 2023-07-08 DIAGNOSIS — Z13220 Encounter for screening for lipoid disorders: Secondary | ICD-10-CM | POA: Diagnosis not present

## 2023-07-08 DIAGNOSIS — F411 Generalized anxiety disorder: Secondary | ICD-10-CM | POA: Diagnosis not present

## 2023-07-08 DIAGNOSIS — E039 Hypothyroidism, unspecified: Secondary | ICD-10-CM | POA: Diagnosis not present

## 2023-07-08 DIAGNOSIS — R7303 Prediabetes: Secondary | ICD-10-CM | POA: Diagnosis not present

## 2023-07-16 DIAGNOSIS — F322 Major depressive disorder, single episode, severe without psychotic features: Secondary | ICD-10-CM | POA: Diagnosis not present

## 2023-07-16 DIAGNOSIS — M79601 Pain in right arm: Secondary | ICD-10-CM | POA: Diagnosis not present

## 2023-07-16 DIAGNOSIS — M7711 Lateral epicondylitis, right elbow: Secondary | ICD-10-CM | POA: Diagnosis not present

## 2023-07-30 DIAGNOSIS — F322 Major depressive disorder, single episode, severe without psychotic features: Secondary | ICD-10-CM | POA: Diagnosis not present

## 2023-08-10 DIAGNOSIS — F322 Major depressive disorder, single episode, severe without psychotic features: Secondary | ICD-10-CM | POA: Diagnosis not present

## 2023-08-12 DIAGNOSIS — L814 Other melanin hyperpigmentation: Secondary | ICD-10-CM | POA: Diagnosis not present

## 2023-08-12 DIAGNOSIS — Z85828 Personal history of other malignant neoplasm of skin: Secondary | ICD-10-CM | POA: Diagnosis not present

## 2023-08-12 DIAGNOSIS — D229 Melanocytic nevi, unspecified: Secondary | ICD-10-CM | POA: Diagnosis not present

## 2023-08-18 DIAGNOSIS — N644 Mastodynia: Secondary | ICD-10-CM | POA: Diagnosis not present

## 2023-08-28 DIAGNOSIS — N6002 Solitary cyst of left breast: Secondary | ICD-10-CM | POA: Diagnosis not present

## 2023-08-28 DIAGNOSIS — N644 Mastodynia: Secondary | ICD-10-CM | POA: Diagnosis not present

## 2023-09-08 DIAGNOSIS — Z01419 Encounter for gynecological examination (general) (routine) without abnormal findings: Secondary | ICD-10-CM | POA: Diagnosis not present

## 2023-09-08 DIAGNOSIS — E039 Hypothyroidism, unspecified: Secondary | ICD-10-CM | POA: Diagnosis not present

## 2023-09-11 DIAGNOSIS — F322 Major depressive disorder, single episode, severe without psychotic features: Secondary | ICD-10-CM | POA: Diagnosis not present

## 2023-09-16 DIAGNOSIS — M19072 Primary osteoarthritis, left ankle and foot: Secondary | ICD-10-CM | POA: Diagnosis not present

## 2023-09-21 DIAGNOSIS — F322 Major depressive disorder, single episode, severe without psychotic features: Secondary | ICD-10-CM | POA: Diagnosis not present

## 2023-10-26 DIAGNOSIS — Z634 Disappearance and death of family member: Secondary | ICD-10-CM | POA: Diagnosis not present

## 2023-10-26 DIAGNOSIS — F322 Major depressive disorder, single episode, severe without psychotic features: Secondary | ICD-10-CM | POA: Diagnosis not present

## 2023-12-10 DIAGNOSIS — F322 Major depressive disorder, single episode, severe without psychotic features: Secondary | ICD-10-CM | POA: Diagnosis not present

## 2023-12-10 DIAGNOSIS — Z634 Disappearance and death of family member: Secondary | ICD-10-CM | POA: Diagnosis not present

## 2023-12-30 DIAGNOSIS — E039 Hypothyroidism, unspecified: Secondary | ICD-10-CM | POA: Diagnosis not present

## 2024-05-23 DIAGNOSIS — N611 Abscess of the breast and nipple: Secondary | ICD-10-CM | POA: Diagnosis not present

## 2024-05-30 DIAGNOSIS — N611 Abscess of the breast and nipple: Secondary | ICD-10-CM | POA: Diagnosis not present

## 2024-06-02 ENCOUNTER — Encounter: Payer: Self-pay | Admitting: Family Medicine

## 2024-06-03 DIAGNOSIS — M7662 Achilles tendinitis, left leg: Secondary | ICD-10-CM | POA: Diagnosis not present

## 2024-06-07 DIAGNOSIS — R92322 Mammographic fibroglandular density, left breast: Secondary | ICD-10-CM | POA: Diagnosis not present

## 2024-06-07 DIAGNOSIS — N611 Abscess of the breast and nipple: Secondary | ICD-10-CM | POA: Diagnosis not present

## 2024-06-21 ENCOUNTER — Ambulatory Visit: Payer: Self-pay | Admitting: General Surgery

## 2024-06-21 DIAGNOSIS — L720 Epidermal cyst: Secondary | ICD-10-CM | POA: Diagnosis not present

## 2024-07-01 ENCOUNTER — Other Ambulatory Visit: Payer: Self-pay

## 2024-07-01 ENCOUNTER — Encounter (HOSPITAL_BASED_OUTPATIENT_CLINIC_OR_DEPARTMENT_OTHER): Payer: Self-pay | Admitting: General Surgery

## 2024-07-05 MED ORDER — CHLORHEXIDINE GLUCONATE CLOTH 2 % EX PADS: 6.0000 | MEDICATED_PAD | Freq: Once | CUTANEOUS | Status: DC

## 2024-07-05 NOTE — Progress Notes (Signed)

## 2024-07-08 ENCOUNTER — Encounter (HOSPITAL_BASED_OUTPATIENT_CLINIC_OR_DEPARTMENT_OTHER)
Admission: RE | Disposition: A | Discharge status: Home / Self Care | Payer: Self-pay | Source: Home / Self Care | Attending: General Surgery

## 2024-07-08 ENCOUNTER — Encounter (HOSPITAL_BASED_OUTPATIENT_CLINIC_OR_DEPARTMENT_OTHER): Payer: Self-pay | Admitting: General Surgery

## 2024-07-08 ENCOUNTER — Ambulatory Visit (HOSPITAL_BASED_OUTPATIENT_CLINIC_OR_DEPARTMENT_OTHER): Payer: Self-pay | Admitting: Anesthesiology

## 2024-07-08 ENCOUNTER — Ambulatory Visit (HOSPITAL_BASED_OUTPATIENT_CLINIC_OR_DEPARTMENT_OTHER)
Admission: RE | Admit: 2024-07-08 | Discharge: 2024-07-08 | Disposition: A | Discharge status: Home / Self Care | Attending: General Surgery | Admitting: General Surgery

## 2024-07-08 DIAGNOSIS — K219 Gastro-esophageal reflux disease without esophagitis: Secondary | ICD-10-CM | POA: Diagnosis not present

## 2024-07-08 DIAGNOSIS — R519 Headache, unspecified: Secondary | ICD-10-CM | POA: Insufficient documentation

## 2024-07-08 DIAGNOSIS — L720 Epidermal cyst: Secondary | ICD-10-CM | POA: Insufficient documentation

## 2024-07-08 DIAGNOSIS — Z79899 Other long term (current) drug therapy: Secondary | ICD-10-CM | POA: Diagnosis not present

## 2024-07-08 DIAGNOSIS — I10 Essential (primary) hypertension: Secondary | ICD-10-CM | POA: Diagnosis not present

## 2024-07-08 DIAGNOSIS — N61 Mastitis without abscess: Secondary | ICD-10-CM | POA: Diagnosis not present

## 2024-07-08 DIAGNOSIS — N6002 Solitary cyst of left breast: Secondary | ICD-10-CM | POA: Diagnosis not present

## 2024-07-08 DIAGNOSIS — Z01818 Encounter for other preprocedural examination: Secondary | ICD-10-CM

## 2024-07-08 HISTORY — PX: BREAST CYST EXCISION: SHX579

## 2024-07-08 HISTORY — DX: Unspecified osteoarthritis, unspecified site: M19.90

## 2024-07-08 SURGERY — EXCISION, CYST, BREAST
Anesthesia: General | Site: Breast | Laterality: Left

## 2024-07-08 MED ORDER — ONDANSETRON HCL 4 MG/2ML IJ SOLN
INTRAMUSCULAR | Status: AC
Start: 2024-07-08 — End: 2024-07-08
  Filled 2024-07-08: qty 2

## 2024-07-08 MED ORDER — PROPOFOL 10 MG/ML IV BOLUS
INTRAVENOUS | Status: DC | PRN
  Administered 2024-07-08: 200 mg via INTRAVENOUS

## 2024-07-08 MED ORDER — KETOROLAC TROMETHAMINE 30 MG/ML IJ SOLN
INTRAMUSCULAR | Status: AC
  Filled 2024-07-08: qty 1

## 2024-07-08 MED ORDER — CEFAZOLIN SODIUM-DEXTROSE 2-4 GM/100ML-% IV SOLN
2.0000 g | INTRAVENOUS | Status: AC
  Administered 2024-07-08: 2 g via INTRAVENOUS

## 2024-07-08 MED ORDER — ONDANSETRON HCL 4 MG/2ML IJ SOLN
INTRAMUSCULAR | Status: DC | PRN
Start: 2024-07-08 — End: 2024-07-08
  Administered 2024-07-08: 4 mg via INTRAVENOUS

## 2024-07-08 MED ORDER — BUPIVACAINE-EPINEPHRINE 0.25% -1:200000 IJ SOLN
INTRAMUSCULAR | Status: DC | PRN
  Administered 2024-07-08: 10 mL
  Administered 2024-07-08: 6 mL

## 2024-07-08 MED ORDER — BUPIVACAINE-EPINEPHRINE (PF) 0.25% -1:200000 IJ SOLN
INTRAMUSCULAR | Status: AC
Start: 2024-07-08 — End: 2024-07-08
  Filled 2024-07-08: qty 30

## 2024-07-08 MED ORDER — MIDAZOLAM HCL 2 MG/2ML IJ SOLN
INTRAMUSCULAR | Status: AC
  Filled 2024-07-08: qty 2

## 2024-07-08 MED ORDER — CEFAZOLIN SODIUM-DEXTROSE 2-4 GM/100ML-% IV SOLN
INTRAVENOUS | Status: AC
  Filled 2024-07-08: qty 100

## 2024-07-08 MED ORDER — ACETAMINOPHEN 500 MG PO TABS
1000.0000 mg | ORAL_TABLET | ORAL | Status: AC
  Administered 2024-07-08: 1000 mg via ORAL

## 2024-07-08 MED ORDER — DEXMEDETOMIDINE HCL IN NACL 80 MCG/20ML IV SOLN
INTRAVENOUS | Status: DC | PRN
  Administered 2024-07-08: 4 ug via INTRAVENOUS
  Administered 2024-07-08: 8 ug via INTRAVENOUS

## 2024-07-08 MED ORDER — LACTATED RINGERS IV SOLN: INTRAVENOUS | Status: DC

## 2024-07-08 MED ORDER — GABAPENTIN 100 MG PO CAPS
100.0000 mg | ORAL_CAPSULE | ORAL | Status: AC
  Administered 2024-07-08: 100 mg via ORAL

## 2024-07-08 MED ORDER — GABAPENTIN 100 MG PO CAPS
ORAL_CAPSULE | ORAL | Status: AC
Start: 2024-07-08 — End: 2024-07-08
  Filled 2024-07-08: qty 1

## 2024-07-08 MED ORDER — LIDOCAINE 2% (20 MG/ML) 5 ML SYRINGE
INTRAMUSCULAR | Status: DC | PRN
  Administered 2024-07-08: 100 mg via INTRAVENOUS

## 2024-07-08 MED ORDER — MIDAZOLAM HCL 5 MG/5ML IJ SOLN
INTRAMUSCULAR | Status: DC | PRN
  Administered 2024-07-08: 2 mg via INTRAVENOUS

## 2024-07-08 MED ORDER — AMISULPRIDE (ANTIEMETIC) 5 MG/2ML IV SOLN: 10.0000 mg | Freq: Once | INTRAVENOUS | Status: DC | PRN

## 2024-07-08 MED ORDER — PHENYLEPHRINE 80 MCG/ML (10ML) SYRINGE FOR IV PUSH (FOR BLOOD PRESSURE SUPPORT)
PREFILLED_SYRINGE | INTRAVENOUS | Status: DC | PRN
  Administered 2024-07-08: 40 ug via INTRAVENOUS

## 2024-07-08 MED ORDER — OXYCODONE HCL 5 MG/5ML PO SOLN: 5.0000 mg | Freq: Once | ORAL | Status: DC | PRN

## 2024-07-08 MED ORDER — LACTATED RINGERS IV SOLN: INTRAVENOUS | Status: DC | PRN

## 2024-07-08 MED ORDER — DEXAMETHASONE SODIUM PHOSPHATE 10 MG/ML IJ SOLN
INTRAMUSCULAR | Status: AC
  Filled 2024-07-08: qty 1

## 2024-07-08 MED ORDER — FENTANYL CITRATE (PF) 100 MCG/2ML IJ SOLN
INTRAMUSCULAR | Status: AC
  Filled 2024-07-08: qty 2

## 2024-07-08 MED ORDER — GLYCOPYRROLATE PF 0.2 MG/ML IJ SOSY
PREFILLED_SYRINGE | INTRAMUSCULAR | Status: DC | PRN
  Administered 2024-07-08: .2 mg via INTRAVENOUS

## 2024-07-08 MED ORDER — LIDOCAINE 2% (20 MG/ML) 5 ML SYRINGE
INTRAMUSCULAR | Status: AC
Start: 2024-07-08 — End: 2024-07-08
  Filled 2024-07-08: qty 5

## 2024-07-08 MED ORDER — FENTANYL CITRATE (PF) 100 MCG/2ML IJ SOLN
INTRAMUSCULAR | Status: AC
Start: 2024-07-08 — End: 2024-07-08
  Filled 2024-07-08: qty 2

## 2024-07-08 MED ORDER — OXYCODONE HCL 5 MG PO TABS: 5.0000 mg | ORAL_TABLET | Freq: Four times a day (QID) | ORAL | 0 refills | Status: AC | PRN

## 2024-07-08 MED ORDER — FENTANYL CITRATE (PF) 100 MCG/2ML IJ SOLN: 25.0000 ug | INTRAMUSCULAR | Status: DC | PRN

## 2024-07-08 MED ORDER — PHENYLEPHRINE 80 MCG/ML (10ML) SYRINGE FOR IV PUSH (FOR BLOOD PRESSURE SUPPORT)
PREFILLED_SYRINGE | INTRAVENOUS | Status: AC
  Filled 2024-07-08: qty 10

## 2024-07-08 MED ORDER — ACETAMINOPHEN 500 MG PO TABS
ORAL_TABLET | ORAL | Status: AC
  Filled 2024-07-08: qty 2

## 2024-07-08 MED ORDER — DEXAMETHASONE SODIUM PHOSPHATE 10 MG/ML IJ SOLN
INTRAMUSCULAR | Status: DC | PRN
  Administered 2024-07-08: 10 mg via INTRAVENOUS

## 2024-07-08 MED ORDER — OXYCODONE HCL 5 MG PO TABS: 5.0000 mg | ORAL_TABLET | Freq: Once | ORAL | Status: DC | PRN

## 2024-07-08 MED ORDER — FENTANYL CITRATE (PF) 100 MCG/2ML IJ SOLN
INTRAMUSCULAR | Status: DC | PRN
  Administered 2024-07-08 (×2): 25 ug via INTRAVENOUS
  Administered 2024-07-08: 50 ug via INTRAVENOUS

## 2024-07-08 MED ORDER — DEXMEDETOMIDINE HCL IN NACL 80 MCG/20ML IV SOLN
INTRAVENOUS | Status: AC
  Filled 2024-07-08: qty 20

## 2024-07-08 SURGICAL SUPPLY — 31 items
BLADE SURG 15 STRL LF DISP TIS (BLADE) ×1 IMPLANT
CANISTER SUCT 1200ML W/VALVE (MISCELLANEOUS) ×1 IMPLANT
CHLORAPREP W/TINT 26 (MISCELLANEOUS) ×1 IMPLANT
CLIP APPLIE 9.375 MED OPEN (MISCELLANEOUS) IMPLANT
COVER BACK TABLE 60X90IN (DRAPES) ×1 IMPLANT
COVER MAYO STAND STRL (DRAPES) ×1 IMPLANT
DERMABOND ADVANCED .7 DNX12 (GAUZE/BANDAGES/DRESSINGS) ×1 IMPLANT
DRAPE LAPAROSCOPIC ABDOMINAL (DRAPES) ×1 IMPLANT
DRAPE UTILITY XL STRL (DRAPES) ×1 IMPLANT
ELECT COATED BLADE 2.86 ST (ELECTRODE) ×1 IMPLANT
ELECTRODE BLDE 4.0 EZ CLN MEGD (MISCELLANEOUS) IMPLANT
ELECTRODE REM PT RTRN 9FT ADLT (ELECTROSURGICAL) ×1 IMPLANT
GLOVE BIO SURGEON STRL SZ7.5 (GLOVE) ×1 IMPLANT
GOWN STRL REUS W/ TWL LRG LVL3 (GOWN DISPOSABLE) ×2 IMPLANT
KIT MARKER MARGIN INK (KITS) IMPLANT
NDL HYPO 25X1 1.5 SAFETY (NEEDLE) ×1 IMPLANT
NEEDLE HYPO 25X1 1.5 SAFETY (NEEDLE) ×1 IMPLANT
NS IRRIG 1000ML POUR BTL (IV SOLUTION) ×1 IMPLANT
PACK BASIN DAY SURGERY FS (CUSTOM PROCEDURE TRAY) ×1 IMPLANT
PENCIL SMOKE EVACUATOR (MISCELLANEOUS) ×1 IMPLANT
SLEEVE SCD COMPRESS KNEE MED (STOCKING) ×1 IMPLANT
SPIKE FLUID TRANSFER (MISCELLANEOUS) ×1 IMPLANT
SPONGE T-LAP 18X18 ~~LOC~~+RFID (SPONGE) ×1 IMPLANT
SUT MON AB 4-0 PC3 18 (SUTURE) ×1 IMPLANT
SUT SILK 2 0 SH (SUTURE) ×1 IMPLANT
SUT VICRYL 3-0 CR8 SH (SUTURE) ×1 IMPLANT
SYR CONTROL 10ML LL (SYRINGE) ×1 IMPLANT
TOWEL GREEN STERILE FF (TOWEL DISPOSABLE) ×1 IMPLANT
TRAY FAXITRON CT DISP (TRAY / TRAY PROCEDURE) IMPLANT
TUBE CONNECTING 20X1/4 (TUBING) ×1 IMPLANT
YANKAUER SUCT BULB TIP NO VENT (SUCTIONS) ×1 IMPLANT

## 2024-07-08 NOTE — Anesthesia Preprocedure Evaluation (Addendum)
 Anesthesia Evaluation  Patient identified by MRN, date of birth, ID band Patient awake    Reviewed: Allergy & Precautions, NPO status , Patient's Chart, lab work & pertinent test results  History of Anesthesia Complications Negative for: history of anesthetic complications  Airway Mallampati: III  TM Distance: >3 FB Neck ROM: Full   Comment: Previous grade III view with MAC 3, easy mask Dental  (+) Dental Advisory Given   Pulmonary neg pulmonary ROS   Pulmonary exam normal breath sounds clear to auscultation       Cardiovascular hypertension, (-) angina (-) Past MI, (-) Cardiac Stents and (-) CABG (-) dysrhythmias  Rhythm:Regular Rate:Normal  Carotid artery-cavernous sinus fistula   Neuro/Psych  Headaches, neg Seizures    GI/Hepatic Neg liver ROS,GERD  Medicated,,  Endo/Other  neg diabetesHypothyroidism    Renal/GU negative Renal ROS     Musculoskeletal  (+) Arthritis ,    Abdominal  (+) + obese  Peds  Hematology negative hematology ROS (+)   Anesthesia Other Findings   Reproductive/Obstetrics                              Anesthesia Physical Anesthesia Plan  ASA: 2  Anesthesia Plan: General   Post-op Pain Management: Tylenol  PO (pre-op)*   Induction: Intravenous  PONV Risk Score and Plan: 3 and Ondansetron , Dexamethasone , Midazolam  and Treatment may vary due to age or medical condition  Airway Management Planned: LMA  Additional Equipment:   Intra-op Plan:   Post-operative Plan: Extubation in OR  Informed Consent: I have reviewed the patients History and Physical, chart, labs and discussed the procedure including the risks, benefits and alternatives for the proposed anesthesia with the patient or authorized representative who has indicated his/her understanding and acceptance.     Dental advisory given  Plan Discussed with: CRNA and Anesthesiologist  Anesthesia Plan  Comments: (Risks of general anesthesia discussed including, but not limited to, sore throat, hoarse voice, chipped/damaged teeth, injury to vocal cords, nausea and vomiting, allergic reactions, lung infection, heart attack, stroke, and death. All questions answered. )         Anesthesia Quick Evaluation

## 2024-07-08 NOTE — Op Note (Signed)
 07/08/2024  11:28 AM  PATIENT:  Sarah Hayes  58 y.o. female  PRE-OPERATIVE DIAGNOSIS:  CYST LEFT BREAST  POST-OPERATIVE DIAGNOSIS:  CYST LEFT BREAST  PROCEDURE:  Procedure(s) with comments: EXCISION CYST LEFT BREAST  SURGEON:  Surgeons and Role:    DEWAINE Curvin Deward DOUGLAS, MD - Primary  PHYSICIAN ASSISTANT:   ASSISTANTS: none   ANESTHESIA:   local and general  EBL:  5 mL   BLOOD ADMINISTERED:none  DRAINS: none   LOCAL MEDICATIONS USED:  MARCAINE     SPECIMEN:  Source of Specimen:  left breast tissue  DISPOSITION OF SPECIMEN:  PATHOLOGY  COUNTS:  YES  TOURNIQUET:  * No tourniquets in log *  DICTATION: .Dragon Dictation  After informed consent was obtained the patient was brought to the operating room and placed in the supine position on the operating table.  After adequate induction of general anesthesia the patient's left breast was prepped with ChloraPrep, allowed to dry, and draped in usual sterile manner.  An appropriate timeout was performed.  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  The area around this was infiltrated with quarter percent Marcaine.  An elliptical incision was made in the skin around the abnormal area.  The incision was carried through the skin and subcutaneous tissue sharply with the electrocautery.  Dissection was then carried shallowly into the breast tissue.  There was no evidence of chronic cyst or abscess cavity in the breast tissue itself.  Once this tissue was removed it was oriented with a short stitch on the superior surface and a long stitch on the lateral surface.  The tissue was sent to pathology for further evaluation.  Hemostasis was achieved using the Bovie electrocautery.  The wound was irrigated with saline and infiltrated with more quarter percent Marcaine.  The incision was then closed with interrupted 4-0 Monocryl subcuticular stitches.  Dermabond dressings were applied.  The patient tolerated the procedure  well.  At the end of the case all needle sponge and instrument counts were correct.  The patient was then awakened and taken to recovery in stable condition.  The excised area measured approximately 4 x 1 cm  PLAN OF CARE: Discharge to home after PACU  PATIENT DISPOSITION:  PACU - hemodynamically stable.   Delay start of Pharmacological VTE agent (>24hrs) due to surgical blood loss or risk of bleeding: not applicable

## 2024-07-08 NOTE — Anesthesia Postprocedure Evaluation (Signed)
 Anesthesia Post Note  Patient: [name]  Procedure(s) Performed: EXCISION, CYST, BREAST (Left: Breast)     Patient location during evaluation: PACU Anesthesia Type: General Level of consciousness: awake Pain management: pain level controlled Vital Signs Assessment: post-procedure vital signs reviewed and stable Respiratory status: spontaneous breathing, nonlabored ventilation and respiratory function stable Cardiovascular status: blood pressure returned to baseline and stable Postop Assessment: no apparent nausea or vomiting Anesthetic complications: no   No notable events documented.  Last Vitals:  Vitals:   07/08/24 1200 07/08/24 1237  BP: 115/64 131/78  Pulse: 70 (!) 58  Resp: 13 16  Temp:  (!) 36.2 C  SpO2: 95% 97%    Last Pain:  Vitals:   07/08/24 1237  TempSrc: Temporal  PainSc: 1                  Delon Aisha Arch

## 2024-07-08 NOTE — Interval H&P Note (Signed)
 History and Physical Interval Note:  07/08/2024 10:33 AM  Sarah Hayes  has presented today for surgery, with the diagnosis of CYST LEFT BREAST.  The various methods of treatment have been discussed with the patient and family. After consideration of risks, benefits and other options for treatment, the patient has consented to  Procedure(s) with comments: EXCISION, CYST, BREAST (Left) - EXCISION CYST LEFT BREAST as a surgical intervention.  The patient's history has been reviewed, patient examined, no change in status, stable for surgery.  I have reviewed the patient's chart and labs.  Questions were answered to the patient's satisfaction.     [name]

## 2024-07-08 NOTE — Discharge Instructions (Signed)

## 2024-07-08 NOTE — H&P (Signed)
 REFERRING PHYSICIAN: Letha Savant, DO PROVIDER: DEWARD GARNETTE NULL, MD MRN: [id number] DOB: [birth year] Subjective   Chief Complaint: New Consultation  History of Present Illness: Sarah Hayes is a 58 y.o. female who is seen today as an office consultation for evaluation of New Consultation  We are asked to see the patient in consultation by Dr. Adolph Lindau to evaluate her for a cyst on the left breast. The patient is a 58 year old white female who has had 2 episodes of pain and drainage from the same spot in the lower inner quadrant of the left breast near the inframammary fold. Antibiotics have not helped. The most recent episode occurred about a month ago. She is otherwise in good health and does not smoke. Her mammograms have been normal  Review of Systems: A complete review of systems was obtained from the patient. I have reviewed this information and discussed as appropriate with the patient. See HPI as well for other ROS.  ROS   Medical History: Past Medical History:  Diagnosis Date  Hypertension  Thyroid  disease   Patient Active Problem List  Diagnosis  Inclusion cyst of left breast   Past Surgical History:  Procedure Laterality Date  SHOULDER ARTHROSCOPY WITH DEBRIDEMENT, ROTATOR CUFF REPAIR, SUBACROMIAL DECOMPRESSION (Left: Shoulder) 12/01/2016  Dr. DOROTHA Herring  ACHILLES TENDON REPAIR / RECONSTRUCTION PRIMARY 03/09/2020  Dr. Joaquim - [facility]  Spur removal shoulder    Allergies  Allergen Reactions  Amoxicillin  Headache   Current Outpatient Medications on File Prior to Visit  Medication Sig Dispense Refill  ascorbic acid, vitamin C, (VITAMIN C) 250 MG tablet Take 250 mg by mouth once daily  cholecalciferol 1000 unit tablet Take by mouth  esomeprazole (NEXIUM) 40 MG DR capsule Take 40 mg by mouth  levothyroxine (SYNTHROID) 100 MCG tablet  levothyroxine (SYNTHROID) 112 MCG tablet  multivitamin tablet Take 1 tablet by mouth once daily  SUMAtriptan  (IMITREX ) 100 MG  tablet  ZINC ACETATE ORAL Take by mouth   No current facility-administered medications on file prior to visit.   Family History  Problem Relation Age of Onset  Skin cancer Mother  High blood pressure (Hypertension) Mother  Diabetes Father  High blood pressure (Hypertension) Father    Social History   Tobacco Use  Smoking Status Never  Smokeless Tobacco Never    Social History   Socioeconomic History  Marital status: Married  Tobacco Use  Smoking status: Never  Smokeless tobacco: Never  Substance and Sexual Activity  Alcohol use: Never  Drug use: Never   Social Drivers of Health   Housing Stability: Unknown (06/21/2024)  Housing Stability Vital Sign  Homeless in the Last Year: No   Objective:   Vitals:  BP: 122/80  Pulse: 83  Temp: 36.6 C (97.9 F)  SpO2: 99%  Weight: 95.4 kg (210 lb 6.4 oz)  Height: 170.2 cm (5' 7)  PainSc: 0-No pain  PainLoc: Breast   Body mass index is 32.95 kg/m.  Physical Exam Vitals reviewed.  Constitutional:  General: She is not in acute distress. Appearance: Normal appearance.  HENT:  Head: Normocephalic and atraumatic.  Right Ear: External ear normal.  Left Ear: External ear normal.  Nose: Nose normal.  Mouth/Throat:  Mouth: Mucous membranes are moist.  Pharynx: Oropharynx is clear.   Eyes:  General: No scleral icterus. Extraocular Movements: Extraocular movements intact.  Conjunctiva/sclera: Conjunctivae normal.  Pupils: Pupils are equal, round, and reactive to light.   Cardiovascular:  Rate and Rhythm: Normal rate and regular rhythm.  Pulses: Normal  pulses.  Heart sounds: Normal heart sounds.  Pulmonary:  Effort: Pulmonary effort is normal. No respiratory distress.  Breath sounds: Normal breath sounds.  Abdominal:  General: Bowel sounds are normal.  Palpations: Abdomen is soft.  Tenderness: There is no abdominal tenderness.   Musculoskeletal:  General: No swelling, tenderness or deformity. Normal range of  motion.  Cervical back: Normal range of motion and neck supple.   Skin: General: Skin is warm and dry.  Coloration: Skin is not jaundiced.   Neurological:  General: No focal deficit present.  Mental Status: She is alert and oriented to person, place, and time.   Psychiatric:  Mood and Affect: Mood normal.  Behavior: Behavior normal.     Breast: There is no palpable mass in either breast. There is no palpable axillary, supraclavicular, or cervical lymphadenopathy. 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 Because of the recurrent nature of the infection I do feel she would benefit from having this area excised. She would also like to have this done. I have discussed with her in detail the risks and benefits of the operation as well as some of the technical aspects and she understands and wishes to proceed.

## 2024-07-08 NOTE — Anesthesia Procedure Notes (Signed)
 Procedure Name: LMA Insertion Date/Time: 07/08/2024 11:01 AM  Performed by: Denton Niels CROME, CRNAPre-anesthesia Checklist: Patient identified, Emergency Drugs available, Suction available, Patient being monitored and Timeout performed Patient Re-evaluated:Patient Re-evaluated prior to induction Oxygen Delivery Method: Circle system utilized Preoxygenation: Pre-oxygenation with 100% oxygen Induction Type: IV induction Ventilation: Mask ventilation without difficulty LMA: LMA inserted LMA Size: 4.0 Number of attempts: 1 Placement Confirmation: positive ETCO2 Dental Injury: Teeth and Oropharynx as per pre-operative assessment

## 2024-07-08 NOTE — Transfer of Care (Signed)
 Immediate Anesthesia Transfer of Care Note  Patient: Sarah Hayes  Procedure(s) Performed: EXCISION, CYST, BREAST (Left: Breast)  Patient Location: PACU  Anesthesia Type:General  Level of Consciousness: awake, patient cooperative, and responds to stimulation  Airway & Oxygen Therapy: Patient Spontanous Breathing and Patient connected to face mask oxygen  Post-op Assessment: Report given to RN and Post -op Vital signs reviewed and stable  Post vital signs: Reviewed and stable  Last Vitals:  Vitals Value Taken Time  BP    Temp    Pulse    Resp    SpO2      Last Pain:  Vitals:   07/08/24 1029  TempSrc: Temporal  PainSc: 0-No pain      Patients Stated Pain Goal: 3 (07/08/24 1029)  Complications: No notable events documented.

## 2024-07-09 ENCOUNTER — Encounter (HOSPITAL_BASED_OUTPATIENT_CLINIC_OR_DEPARTMENT_OTHER): Payer: Self-pay | Admitting: General Surgery

## 2024-07-11 LAB — SURGICAL PATHOLOGY

## 2024-07-12 ENCOUNTER — Ambulatory Visit: Payer: Self-pay | Admitting: General Surgery

## 2024-08-11 DIAGNOSIS — Z08 Encounter for follow-up examination after completed treatment for malignant neoplasm: Secondary | ICD-10-CM | POA: Diagnosis not present

## 2024-08-11 DIAGNOSIS — L821 Other seborrheic keratosis: Secondary | ICD-10-CM | POA: Diagnosis not present

## 2024-08-11 DIAGNOSIS — Z85828 Personal history of other malignant neoplasm of skin: Secondary | ICD-10-CM | POA: Diagnosis not present

## 2024-08-11 DIAGNOSIS — D229 Melanocytic nevi, unspecified: Secondary | ICD-10-CM | POA: Diagnosis not present

## 2024-08-29 DIAGNOSIS — Z1231 Encounter for screening mammogram for malignant neoplasm of breast: Secondary | ICD-10-CM | POA: Diagnosis not present

## 2024-09-02 DIAGNOSIS — N6002 Solitary cyst of left breast: Secondary | ICD-10-CM | POA: Diagnosis not present

## 2024-09-02 DIAGNOSIS — R928 Other abnormal and inconclusive findings on diagnostic imaging of breast: Secondary | ICD-10-CM | POA: Diagnosis not present

## 2024-09-07 DIAGNOSIS — G43909 Migraine, unspecified, not intractable, without status migrainosus: Secondary | ICD-10-CM | POA: Diagnosis not present

## 2024-09-07 DIAGNOSIS — F411 Generalized anxiety disorder: Secondary | ICD-10-CM | POA: Diagnosis not present

## 2024-09-07 DIAGNOSIS — Z13220 Encounter for screening for lipoid disorders: Secondary | ICD-10-CM | POA: Diagnosis not present

## 2024-09-07 DIAGNOSIS — E039 Hypothyroidism, unspecified: Secondary | ICD-10-CM | POA: Diagnosis not present

## 2024-09-07 DIAGNOSIS — R7303 Prediabetes: Secondary | ICD-10-CM | POA: Diagnosis not present

## 2024-09-07 DIAGNOSIS — Z0000 Encounter for general adult medical examination without abnormal findings: Secondary | ICD-10-CM | POA: Diagnosis not present
